# Patient Record
Sex: Female | Born: 1985 | Race: White | Hispanic: No | Marital: Single | Smoking: Former smoker
Health system: Southern US, Community
[De-identification: ages and names within clinical notes are randomized; demographics above are authoritative.]

## PROBLEM LIST (undated history)

## (undated) ENCOUNTER — Emergency Department: Admission: EM | Payer: Self-pay | Source: Home / Self Care

## (undated) DIAGNOSIS — E282 Polycystic ovarian syndrome: Secondary | ICD-10-CM

## (undated) DIAGNOSIS — F32A Depression, unspecified: Secondary | ICD-10-CM

## (undated) DIAGNOSIS — F329 Major depressive disorder, single episode, unspecified: Secondary | ICD-10-CM

## (undated) HISTORY — DX: Polycystic ovarian syndrome: E28.2

## (undated) HISTORY — PX: NO PAST SURGERIES: SHX2092

---
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
## 2007-02-16 ENCOUNTER — Emergency Department: Payer: Self-pay

## 2007-03-04 ENCOUNTER — Emergency Department: Payer: Self-pay | Admitting: Unknown Physician Specialty

## 2007-04-05 ENCOUNTER — Emergency Department: Payer: Self-pay | Admitting: Internal Medicine

## 2007-10-10 ENCOUNTER — Emergency Department: Payer: Self-pay | Admitting: Emergency Medicine

## 2009-11-27 ENCOUNTER — Observation Stay: Payer: Self-pay

## 2009-12-31 ENCOUNTER — Observation Stay: Payer: Self-pay | Admitting: Obstetrics & Gynecology

## 2010-02-11 ENCOUNTER — Inpatient Hospital Stay: Payer: Self-pay

## 2010-05-07 ENCOUNTER — Emergency Department: Payer: Self-pay | Admitting: Emergency Medicine

## 2010-05-17 ENCOUNTER — Emergency Department: Payer: Self-pay | Admitting: Emergency Medicine

## 2013-03-21 ENCOUNTER — Observation Stay: Payer: Self-pay | Admitting: Obstetrics and Gynecology

## 2013-03-30 ENCOUNTER — Inpatient Hospital Stay: Payer: Self-pay | Admitting: Obstetrics & Gynecology

## 2013-03-30 LAB — CBC WITH DIFFERENTIAL/PLATELET
Basophil #: 0 10*3/uL (ref 0.0–0.1)
Basophil %: 0.4 %
Eosinophil #: 0.1 10*3/uL (ref 0.0–0.7)
Eosinophil %: 0.9 %
HCT: 34.4 % — ABNORMAL LOW (ref 35.0–47.0)
HGB: 11.9 g/dL — ABNORMAL LOW (ref 12.0–16.0)
Lymphocyte #: 1.8 10*3/uL (ref 1.0–3.6)
Lymphocyte %: 18 %
MCH: 27.1 pg (ref 26.0–34.0)
MCHC: 34.6 g/dL (ref 32.0–36.0)
MCV: 78 fL — ABNORMAL LOW (ref 80–100)
Monocyte #: 0.8 x10 3/mm (ref 0.2–0.9)
Monocyte %: 7.7 %
Neutrophil #: 7.4 10*3/uL — ABNORMAL HIGH (ref 1.4–6.5)
Neutrophil %: 73 %
Platelet: 152 10*3/uL (ref 150–440)
RBC: 4.39 10*6/uL (ref 3.80–5.20)
RDW: 14.4 % (ref 11.5–14.5)
WBC: 10.2 10*3/uL (ref 3.6–11.0)

## 2013-03-30 LAB — GC/CHLAMYDIA PROBE AMP

## 2013-10-13 ENCOUNTER — Emergency Department: Payer: Self-pay | Admitting: Emergency Medicine

## 2014-01-17 ENCOUNTER — Emergency Department: Payer: Self-pay | Admitting: Emergency Medicine

## 2014-01-21 ENCOUNTER — Emergency Department: Payer: Self-pay | Admitting: Emergency Medicine

## 2014-03-27 ENCOUNTER — Emergency Department: Payer: Self-pay | Admitting: Emergency Medicine

## 2014-04-15 ENCOUNTER — Emergency Department: Payer: Self-pay | Admitting: Emergency Medicine

## 2014-04-15 LAB — URINALYSIS, COMPLETE
Bilirubin,UR: NEGATIVE
Blood: NEGATIVE
GLUCOSE, UR: NEGATIVE mg/dL (ref 0–75)
Nitrite: NEGATIVE
Ph: 5 (ref 4.5–8.0)
Protein: NEGATIVE
RBC,UR: 5 /HPF (ref 0–5)
SPECIFIC GRAVITY: 1.027 (ref 1.003–1.030)
WBC UR: 2 /HPF (ref 0–5)

## 2014-04-15 LAB — GC/CHLAMYDIA PROBE AMP

## 2014-04-15 LAB — PREGNANCY, URINE: Pregnancy Test, Urine: NEGATIVE m[IU]/mL

## 2014-04-15 LAB — WET PREP, GENITAL

## 2014-04-17 LAB — URINE CULTURE

## 2014-04-28 ENCOUNTER — Emergency Department: Payer: Self-pay | Admitting: Emergency Medicine

## 2014-04-28 LAB — COMPREHENSIVE METABOLIC PANEL
Albumin: 3.5 g/dL (ref 3.4–5.0)
Alkaline Phosphatase: 66 U/L
Anion Gap: 9 (ref 7–16)
BILIRUBIN TOTAL: 0.3 mg/dL (ref 0.2–1.0)
BUN: 11 mg/dL (ref 7–18)
CO2: 24 mmol/L (ref 21–32)
Calcium, Total: 8.4 mg/dL — ABNORMAL LOW (ref 8.5–10.1)
Chloride: 108 mmol/L — ABNORMAL HIGH (ref 98–107)
Creatinine: 0.75 mg/dL (ref 0.60–1.30)
EGFR (Non-African Amer.): >60
GLUCOSE: 92 mg/dL (ref 65–99)
OSMOLALITY: 280 (ref 275–301)
Potassium: 4.2 mmol/L (ref 3.5–5.1)
SGOT(AST): 21 U/L (ref 15–37)
SGPT (ALT): 18 U/L
Sodium: 141 mmol/L (ref 136–145)
Total Protein: 7.9 g/dL (ref 6.4–8.2)

## 2014-04-28 LAB — URINALYSIS, COMPLETE
Bacteria: NONE SEEN
Bilirubin,UR: NEGATIVE
Blood: NEGATIVE
Glucose,UR: NEGATIVE mg/dL (ref 0–75)
KETONE: NEGATIVE
Leukocyte Esterase: NEGATIVE
NITRITE: NEGATIVE
Ph: 6 (ref 4.5–8.0)
Protein: NEGATIVE
RBC,UR: 1 /HPF (ref 0–5)
Specific Gravity: 1.023 (ref 1.003–1.030)
WBC UR: <1 /HPF (ref 0–5)

## 2014-04-28 LAB — CBC WITH DIFFERENTIAL/PLATELET
BASOS ABS: 0.1 10*3/uL (ref 0.0–0.1)
Basophil %: 0.7 %
EOS ABS: 0.1 10*3/uL (ref 0.0–0.7)
EOS PCT: 1.1 %
HCT: 44.1 % (ref 35.0–47.0)
HGB: 14.4 g/dL (ref 12.0–16.0)
LYMPHS ABS: 2.4 10*3/uL (ref 1.0–3.6)
Lymphocyte %: 21.9 %
MCH: 28.9 pg (ref 26.0–34.0)
MCHC: 32.5 g/dL (ref 32.0–36.0)
MCV: 89 fL (ref 80–100)
MONOS PCT: 6 %
Monocyte #: 0.7 x10 3/mm (ref 0.2–0.9)
Neutrophil #: 7.7 10*3/uL — ABNORMAL HIGH (ref 1.4–6.5)
Neutrophil %: 70.3 %
Platelet: 216 10*3/uL (ref 150–440)
RBC: 4.96 10*6/uL (ref 3.80–5.20)
RDW: 13.7 % (ref 11.5–14.5)
WBC: 10.9 10*3/uL (ref 3.6–11.0)

## 2014-04-28 LAB — LIPASE, BLOOD: Lipase: 127 U/L (ref 73–393)

## 2014-06-07 ENCOUNTER — Emergency Department: Payer: Self-pay | Admitting: Internal Medicine

## 2014-09-28 ENCOUNTER — Emergency Department: Payer: Self-pay | Admitting: Emergency Medicine

## 2014-10-26 ENCOUNTER — Emergency Department: Payer: Self-pay | Admitting: Emergency Medicine

## 2014-12-22 NOTE — H&P (Signed)
L&D Evaluation:  History Expanded:  HPI Patient report to Labor and Delivery after having contractions that started yesterday that have gotten more painful.   Gravida 3   Term 2   PreTerm 0   Abortion 0   Living 2   Blood Type (Maternal) A positive   Group B Strep Results Maternal (Result >5wks must be treated as unknown) positive   Maternal HIV Negative   Maternal Syphilis Ab Nonreactive   Maternal Varicella Unknown   Rubella Results (Maternal) immune   Maternal T-Dap Unknown   [facility]   Presents with contractions   Patient's Medical History other  depression   Patient's Surgical History none   Medications Pre Natal Vitamins   Allergies NKDA   Social History none   Family History other  HTN, DM II, MS   ROS:  ROS All systems were reviewed.  HEENT, CNS, GI, GU, Respiratory, CV, Renal and Musculoskeletal systems were found to be normal.   Exam:  Vital Signs stable   General no apparent distress   Mental Status clear   Chest clear   Heart normal sinus rhythm   Abdomen gravid, non-tender   Edema no edema   Pelvic 5/80/-2 by RN   Mebranes Intact   FHT normal rate with no decels   Fetal Heart Rate 140   Ucx irregular   Skin dry   Impression:  Impression active labor   Plan:  Plan EFM/NST, monitor contractions and for cervical change, antibiotics for GBBS prophylaxis, fluids   Comments Epidural when desired AROM potentially after abx Anticipate spontaneous vaginal delivery.   Electronic Signatures: Letitia LibraHarris, Zafiro Routson Paul (MD)  (Signed 17-Aug-14 14:47)  Authored: L&D Evaluation   Last Updated: 17-Aug-14 14:47 by Letitia LibraHarris, Orlene Salmons Paul (MD)

## 2014-12-30 ENCOUNTER — Emergency Department
Admission: EM | Admit: 2014-12-30 | Discharge: 2014-12-30 | Disposition: A | Discharge status: Home / Self Care | Payer: Self-pay | Attending: Emergency Medicine | Admitting: Emergency Medicine

## 2014-12-30 DIAGNOSIS — H9201 Otalgia, right ear: Secondary | ICD-10-CM | POA: Insufficient documentation

## 2014-12-30 MED ORDER — NEOMYCIN-POLYMYXIN-HC 3.5-10000-1 OT SOLN: 3.0000 [drp] | Freq: Three times a day (TID) | OTIC | Status: AC

## 2014-12-30 MED ORDER — TRAMADOL HCL 50 MG PO TABS: 50.0000 mg | ORAL_TABLET | Freq: Four times a day (QID) | ORAL | Status: DC | PRN

## 2014-12-30 NOTE — ED Notes (Signed)
Right ear pain that began 3 hours PTA. No injury. Pt alert and oriented X4, active, cooperative, pt in NAD. RR even and unlabored, color WNL.

## 2014-12-30 NOTE — Discharge Instructions (Signed)
Ear Drops °You need to put eardrops in your ear. °HOME CARE  °· Put drops in your affected ear as told. °· After putting in the drops, lie down with the ear you put the drops in facing up. Stay this way for 10 minutes. Use the ear drops as long as your doctor tells you. °· Before you get up, put a cotton ball gently in your ear. Do not push it far in your ear. °· Do not wash out your ears unless your doctor says it is okay. °· Finish all medicines as told by your doctor. You may be told to keep using the eardrops even if you start to feel better. °· See your doctor as told for follow-up visits. °GET HELP IF: °· You have pain that gets worse. °· Any unusual fluid (drainage) is coming from your ear (especially if the fluid stinks). °· You have trouble hearing. °· You get really dizzy as if the room is spinning and feel sick to your stomach (vertigo). °· The outside of your ear becomes red or puffy or both. This may be a sign of an allergic reaction. °MAKE SURE YOU:  °· Understand these instructions. °· Will watch your condition. °· Will get help right away if you are not doing well or get worse. °Document Released: 01/18/2010 Document Revised: 08/05/2013 Document Reviewed: 02/25/2013 °[employer]® Patient Information ©2015 [employer]. This information is not intended to replace advice given to you by your health care provider. Make sure you discuss any questions you have with your health care provider. ° °

## 2014-12-30 NOTE — ED Notes (Signed)
Pt alert and oriented X4, active, cooperative, pt in NAD. RR even and unlabored, color WNL.  Pt informed to return if any life threatening symptoms occur.   

## 2014-12-30 NOTE — ED Provider Notes (Signed)
[facility] Emergency Department Provider Note  ____________________________________________  Time seen: Approximately 1:35 PM  I have reviewed the triage vital signs and the nursing notes.   HISTORY  Chief Complaint Otalgia    HPI Kimberly Arroyo is a 29 y.o. female patient complained of right ear pain which began about 3 hours when she arrived to the ER. Patient state last night she was cleaning both issues and Q-tips but she did not remove any debris from ears. Patient denies any hearing loss just pain in the ear canal. The left ear is not affected. She is rating her pain as 8/10.   History reviewed. No pertinent past medical history.  There are no active problems to display for this patient.   History reviewed. No pertinent past surgical history.  Current Outpatient Rx  Name  Route  Sig  Dispense  Refill  . neomycin-polymyxin-hydrocortisone (CORTISPORIN) otic solution   Left Ear   Place 3 drops into the left ear 3 (three) times daily.   10 mL   0   . traMADol (ULTRAM) 50 MG tablet   Oral   Take 1 tablet (50 mg total) by mouth every 6 (six) hours as needed for moderate pain.   12 tablet   0     Allergies Review of patient's allergies indicates no known allergies.  History reviewed. No pertinent family history.  Social History History  Substance Use Topics  . Smoking status: Never Smoker   . Smokeless tobacco: Not on file  . Alcohol Use: Yes    Review of Systems Constitutional: No fever/chills Eyes: No visual changes. ENT: No sore throat. Cardiovascular: Denies chest pain. Respiratory: Denies shortness of breath. Gastrointestinal: No abdominal pain.  No nausea, no vomiting.  No diarrhea.  No constipation. Genitourinary: Negative for dysuria. Musculoskeletal: Negative for back pain. Skin: Negative for rash. Neurological: Negative for headaches, focal weakness or numbness.  10-point ROS otherwise  negative.  ____________________________________________   PHYSICAL EXAM:  VITAL SIGNS: ED Triage Vitals  Enc Vitals Group     BP 12/30/14 1240 149/79 mmHg     Pulse Rate 12/30/14 1240 79     Resp 12/30/14 1240 16     Temp 12/30/14 1240 98.9 F (37.2 C)     Temp Source 12/30/14 1240 Oral     SpO2 12/30/14 1240 100 %     Weight 12/30/14 1240 230 lb (104.327 kg)     Height 12/30/14 1240 5\' 5"  (1.651 m)     Head Cir --      Peak Flow --      Pain Score 12/30/14 1240 8     Pain Loc --      Pain Edu? --      Excl. in GC? --     Constitutional: Alert and oriented. Well appearing and in mild distress distress. Eyes: Conjunctivae are normal. PERRL. EOMI. Head: Atraumatic. Nose: No congestion/rhinnorhea. Right canal is erythematous and mildly edematous. TM is nonerythematous and nonbulging. Patient had guarding with insertion of the Otoscope Mouth/Throat: Mucous membranes are moist.  Oropharynx non-erythematous. Neck: No stridor. Neck is supple Hematological/Lymphatic/Immunilogical: No cervical lymphadenopathy. Cardiovascular: Normal rate, regular rhythm. Grossly normal heart sounds.  Good peripheral circulation. Respiratory: Normal respiratory effort.  No retractions. Lungs CTAB. Gastrointestinal: Soft and nontender. No distention. No abdominal bruits. No CVA tenderness. Musculoskeletal: No lower extremity tenderness nor edema.  No joint effusions. Neurologic:  Normal speech and language. No gross focal neurologic deficits are appreciated. Speech is normal. No  gait instability. Skin:  Skin is warm, dry and intact. No rash noted. Psychiatric: Mood and affect are normal. Speech and behavior are normal.  ____________________________________________   LABS (all labs ordered are listed, but only abnormal results are displayed)  Labs Reviewed - No data to  display ____________________________________________  EKG   ____________________________________________  RADIOLOGY   ____________________________________________   PROCEDURES  Procedure(s) performed: None  Critical Care performed: No  ____________________________________________   INITIAL IMPRESSION / ASSESSMENT AND PLAN / ED COURSE  Pertinent labs & imaging results that were available during my care of the patient were reviewed by me and considered in my medical decision making (see chart for details).  Otitis Externa ____________________________________________   FINAL CLINICAL IMPRESSION(S) / ED DIAGNOSES  Final diagnoses:  Otalgia of right ear      Joni ReiningRonald K Camelle Henkels, PA-C 12/30/14 1342

## 2014-12-30 NOTE — ED Notes (Signed)
Pt presents with right ear pain.

## 2015-05-03 ENCOUNTER — Emergency Department
Admission: EM | Admit: 2015-05-03 | Discharge: 2015-05-03 | Disposition: A | Discharge status: Home / Self Care | Payer: Self-pay | Attending: Emergency Medicine | Admitting: Emergency Medicine

## 2015-05-03 DIAGNOSIS — N3001 Acute cystitis with hematuria: Secondary | ICD-10-CM | POA: Insufficient documentation

## 2015-05-03 DIAGNOSIS — Z3202 Encounter for pregnancy test, result negative: Secondary | ICD-10-CM | POA: Insufficient documentation

## 2015-05-03 LAB — URINALYSIS COMPLETE WITH MICROSCOPIC (ARMC ONLY)
BACTERIA UA: NONE SEEN
Bilirubin Urine: NEGATIVE
GLUCOSE, UA: NEGATIVE mg/dL
Hgb urine dipstick: NEGATIVE
Ketones, ur: NEGATIVE mg/dL
NITRITE: NEGATIVE
Protein, ur: 30 mg/dL — AB
Specific Gravity, Urine: 1.019 (ref 1.005–1.030)
pH: 6 (ref 5.0–8.0)

## 2015-05-03 MED ORDER — PHENAZOPYRIDINE HCL 200 MG PO TABS: 200.0000 mg | ORAL_TABLET | Freq: Three times a day (TID) | ORAL | Status: DC | PRN

## 2015-05-03 MED ORDER — SULFAMETHOXAZOLE-TRIMETHOPRIM 800-160 MG PO TABS: 1.0000 | ORAL_TABLET | Freq: Two times a day (BID) | ORAL | Status: DC

## 2015-05-03 NOTE — ED Provider Notes (Signed)
[facility] Emergency Department Provider Note ____________________________________________  Time seen: Approximately 6:17 PM  I have reviewed the triage vital signs and the nursing notes.   HISTORY  Chief Complaint Urinary Tract Infection    HPI Kimberly Arroyo is a 29 y.o. female who presents to the emergency department for evaluation of frequent urination. She states that she has some discomfort with urination as well. Symptoms started yesterday. She has not noticed any blood in the urine. She also has some mild left lower back pain.   History reviewed. No pertinent past medical history.  There are no active problems to display for this patient.   History reviewed. No pertinent past surgical history.  Current Outpatient Rx  Name  Route  Sig  Dispense  Refill  . phenazopyridine (PYRIDIUM) 200 MG tablet   Oral   Take 1 tablet (200 mg total) by mouth 3 (three) times daily as needed for pain.   9 tablet   0   . sulfamethoxazole-trimethoprim (BACTRIM DS,SEPTRA DS) 800-160 MG per tablet   Oral   Take 1 tablet by mouth 2 (two) times daily.   10 tablet   0   . traMADol (ULTRAM) 50 MG tablet   Oral   Take 1 tablet (50 mg total) by mouth every 6 (six) hours as needed for moderate pain.   12 tablet   0     Allergies Review of patient's allergies indicates no known allergies.  No family history on file.  Social History Social History  Substance Use Topics  . Smoking status: Never Smoker   . Smokeless tobacco: None  . Alcohol Use: Yes    Review of Systems Constitutional: No fever/chills Cardiovascular: Denies chest pain. Respiratory: Denies shortness of breath or cough. Gastrointestinal: Abdominal pain no., nausea no, vomitingno. Genitourinary: Dysuria yes, vaginal discharge no.. Musculoskeletal: Negative for back pain. Skin: Negative for rash. Neurological: Negative for headaches, focal weakness or numbness.  10-point ROS otherwise  negative.  ____________________________________________   PHYSICAL EXAM:   T: 98.3; HR 79; Resp: 18; BP: 120/75; SpO2 98%.  VITAL SIGNS: ED Triage Vitals  Enc Vitals Group     BP --      Pulse --      Resp --      Temp --      Temp src --      SpO2 --      Weight --      Height --      Head Cir --      Peak Flow --      Pain Score 05/03/15 1816 4     Pain Loc --      Pain Edu? --      Excl. in GC? --     Constitutional: Alert and oriented. Well appearing and in no acute distress. Eyes: Conjunctivae are normal. PERRL. EOMI. Head: Atraumatic. Nose: No congestion/rhinnorhea. Mouth/Throat: Mucous membranes are moist.  Oropharynx non-erythematous. Neck: No stridor. Cardiovascular: Good peripheral circulation. Respiratory: Normal respiratory effort.  No retractions. Gastrointestinal: Soft and nontender. No distention. No abdominal bruits. Genitourinary: Pelvic exam: Not indicated Musculoskeletal: No extremity tenderness nor edema.  Neurologic:  Normal speech and language. No gross focal neurologic deficits are appreciated. Speech is normal. No gait instability. Skin:  Skin is warm, dry and intact. No rash noted. Psychiatric: Mood and affect are normal. Speech and behavior are normal.  ____________________________________________   LABS (all labs ordered are listed, but only abnormal results are displayed)  Labs  Reviewed  URINALYSIS COMPLETEWITH MICROSCOPIC ([facility] ONLY) - Abnormal; Notable for the following:    Color, Urine YELLOW (*)    APPearance HAZY (*)    Protein, ur 30 (*)    Leukocytes, UA 2+ (*)    Squamous Epithelial / LPF 0-5 (*)    All other components within normal limits  POC URINE PREG, ED   ____________________________________________  RADIOLOGY  Not indicated ____________________________________________   PROCEDURES  Procedure(s) performed: None  ____________________________________________   INITIAL IMPRESSION / ASSESSMENT AND PLAN /  ED COURSE  Pertinent labs & imaging results that were available during my care of the patient were reviewed by me and considered in my medical decision making (see chart for details).  Patient was advised to follow-up with the primary care provider for choice for a repeat urinalysis in about 10 days. She will take Bactrim as prescribed as well as Pyridium as needed.   She was advised to return to the emergency department for symptoms that change or worsen if she is unable schedule an appointment. ____________________________________________   FINAL CLINICAL IMPRESSION(S) / ED DIAGNOSES  Final diagnoses:  Acute cystitis with hematuria       Chinita Pester, FNP 05/03/15 1610  Jennye Moccasin, MD 05/03/15 2243

## 2015-05-03 NOTE — ED Notes (Signed)
Increased frequency, painful urination, pressure. Denies blood.

## 2015-05-03 NOTE — Discharge Instructions (Signed)

## 2015-05-04 LAB — POCT PREGNANCY, URINE: Preg Test, Ur: NEGATIVE

## 2015-10-12 ENCOUNTER — Emergency Department: Payer: Managed Care, Other (non HMO)

## 2015-10-12 ENCOUNTER — Encounter: Payer: Self-pay | Admitting: Medical Oncology

## 2015-10-12 ENCOUNTER — Emergency Department
Admission: EM | Admit: 2015-10-12 | Discharge: 2015-10-12 | Disposition: A | Discharge status: Home / Self Care | Payer: Managed Care, Other (non HMO) | Attending: Emergency Medicine | Admitting: Emergency Medicine

## 2015-10-12 DIAGNOSIS — Y9289 Other specified places as the place of occurrence of the external cause: Secondary | ICD-10-CM | POA: Insufficient documentation

## 2015-10-12 DIAGNOSIS — W501XXA Accidental kick by another person, initial encounter: Secondary | ICD-10-CM | POA: Insufficient documentation

## 2015-10-12 DIAGNOSIS — S20212A Contusion of left front wall of thorax, initial encounter: Secondary | ICD-10-CM | POA: Insufficient documentation

## 2015-10-12 DIAGNOSIS — S29001A Unspecified injury of muscle and tendon of front wall of thorax, initial encounter: Secondary | ICD-10-CM | POA: Diagnosis present

## 2015-10-12 DIAGNOSIS — Y9389 Activity, other specified: Secondary | ICD-10-CM | POA: Diagnosis not present

## 2015-10-12 DIAGNOSIS — Z792 Long term (current) use of antibiotics: Secondary | ICD-10-CM | POA: Diagnosis not present

## 2015-10-12 DIAGNOSIS — F17200 Nicotine dependence, unspecified, uncomplicated: Secondary | ICD-10-CM | POA: Diagnosis not present

## 2015-10-12 DIAGNOSIS — Y998 Other external cause status: Secondary | ICD-10-CM | POA: Insufficient documentation

## 2015-10-12 MED ORDER — MELOXICAM 15 MG PO TABS: 15.0000 mg | ORAL_TABLET | Freq: Every day | ORAL | Status: DC

## 2015-10-12 NOTE — ED Notes (Signed)
See triage note. Having pain under left breast and rib area. States her and ber b/f were playing around and he kicked her in the rib area

## 2015-10-12 NOTE — ED Notes (Signed)
Pt reports Saturday her and her boyfriend were "playing around" when his foot kicked her left rib cage, pt reports pain to area. Denies sob. Denies other sx's.

## 2015-10-12 NOTE — Discharge Instructions (Signed)
Blunt Chest Trauma °Blunt chest trauma is an injury caused by a blow to the chest. These chest injuries can be very painful. Blunt chest trauma often results in bruised or broken (fractured) ribs. Most cases of bruised and fractured ribs from blunt chest traumas get better after 1 to 3 weeks of rest and pain medicine. Often, the soft tissue in the chest wall is also injured, causing pain and bruising. Internal organs, such as the heart and lungs, may also be injured. Blunt chest trauma can lead to serious medical problems. This injury requires immediate medical care. °CAUSES  °· Motor vehicle collisions. °· Falls. °· Physical violence. °· Sports injuries. °SYMPTOMS  °· Chest pain. The pain may be worse when you move or breathe deeply. °· Shortness of breath. °· Lightheadedness. °· Bruising. °· Tenderness. °· Swelling. °DIAGNOSIS  °Your caregiver will do a physical exam. X-rays may be taken to look for fractures. However, minor rib fractures may not show up on X-rays until a few days after the injury. If a more serious injury is suspected, further imaging tests may be done. This may include ultrasounds, computed tomography (CT) scans, or magnetic resonance imaging (MRI). °TREATMENT  °Treatment depends on the severity of your injury. Your caregiver may prescribe pain medicines and deep breathing exercises. °HOME CARE INSTRUCTIONS °· Limit your activities until you can move around without much pain. °· Do not do any strenuous work until your injury is healed. °· Put ice on the injured area. °· Put ice in a plastic bag. °· Place a towel between your skin and the bag. °· Leave the ice on for 15-20 minutes, 03-04 times a day. °· You may wear a rib belt as directed by your caregiver to reduce pain. °· Practice deep breathing as directed by your caregiver to keep your lungs clear. °· Only take over-the-counter or prescription medicines for pain, fever, or discomfort as directed by your caregiver. °SEEK IMMEDIATE MEDICAL  CARE IF:  °· You have increasing pain or shortness of breath. °· You cough up blood. °· You have nausea, vomiting, or abdominal pain. °· You have a fever. °· You feel dizzy, weak, or you faint. °MAKE SURE YOU: °· Understand these instructions. °· Will watch your condition. °· Will get help right away if you are not doing well or get worse. °  °This information is not intended to replace advice given to you by your health care provider. Make sure you discuss any questions you have with your health care provider. °  °Document Released: 09/07/2004 Document Revised: 08/21/2014 Document Reviewed: 01/27/2015 °Elsevier Interactive Patient Education ©2016 [employer] ° °Chest Contusion °A chest contusion is a deep bruise on your chest area. Contusions are the result of an injury that caused bleeding under the skin. A chest contusion may involve bruising of the skin, muscles, or ribs. The contusion may turn blue, purple, or yellow. Minor injuries will give you a painless contusion, but more severe contusions may stay painful and swollen for a few weeks. °CAUSES  °A contusion is usually caused by a blow, trauma, or direct force to an area of the body. °SYMPTOMS  °· Swelling and redness of the injured area. °· Discoloration of the injured area. °· Tenderness and soreness of the injured area. °· Pain. °DIAGNOSIS  °The diagnosis can be made by taking a history and performing a physical exam. An X-ray, CT scan, or MRI may be needed to determine if there were any associated injuries, such as broken bones (fractures)   or internal injuries. °TREATMENT  °Often, the best treatment for a chest contusion is resting, icing, and applying cold compresses to the injured area. Deep breathing exercises may be recommended to reduce the risk of pneumonia. Over-the-counter medicines may also be recommended for pain control. °HOME CARE INSTRUCTIONS  °· Put ice on the injured area. °¨ Put ice in a plastic bag. °¨ Place a towel between your skin  and the bag. °¨ Leave the ice on for 15-20 minutes, 03-04 times a day. °· Only take over-the-counter or prescription medicines as directed by your caregiver. Your caregiver may recommend avoiding anti-inflammatory medicines (aspirin, ibuprofen, and naproxen) for 48 hours because these medicines may increase bruising. °· Rest the injured area. °· Perform deep-breathing exercises as directed by your caregiver. °· Stop smoking if you smoke. °· Do not lift objects over 5 pounds (2.3 kg) for 3 days or longer if recommended by your caregiver. °SEEK IMMEDIATE MEDICAL CARE IF:  °· You have increased bruising or swelling. °· You have pain that is getting worse. °· You have difficulty breathing. °· You have dizziness, weakness, or fainting. °· You have blood in your urine or stool. °· You cough up or vomit blood. °· Your swelling or pain is not relieved with medicines. °MAKE SURE YOU:  °· Understand these instructions. °· Will watch your condition. °· Will get help right away if you are not doing well or get worse. °  °This information is not intended to replace advice given to you by your health care provider. Make sure you discuss any questions you have with your health care provider. °  °Document Released: 04/25/2001 Document Revised: 04/24/2012 Document Reviewed: 01/22/2012 °Elsevier Interactive Patient Education ©2016 [employer] ° °
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
## 2015-10-20 ENCOUNTER — Emergency Department
Admission: EM | Admit: 2015-10-20 | Discharge: 2015-10-20 | Disposition: A | Discharge status: Home / Self Care | Payer: Managed Care, Other (non HMO) | Attending: Emergency Medicine | Admitting: Emergency Medicine

## 2015-10-20 DIAGNOSIS — Z791 Long term (current) use of non-steroidal anti-inflammatories (NSAID): Secondary | ICD-10-CM | POA: Insufficient documentation

## 2015-10-20 DIAGNOSIS — F17210 Nicotine dependence, cigarettes, uncomplicated: Secondary | ICD-10-CM | POA: Insufficient documentation

## 2015-10-20 DIAGNOSIS — Z79899 Other long term (current) drug therapy: Secondary | ICD-10-CM | POA: Diagnosis not present

## 2015-10-20 DIAGNOSIS — B349 Viral infection, unspecified: Secondary | ICD-10-CM | POA: Diagnosis not present

## 2015-10-20 DIAGNOSIS — R52 Pain, unspecified: Secondary | ICD-10-CM | POA: Diagnosis present

## 2015-10-20 MED ORDER — IBUPROFEN 800 MG PO TABS: 800.0000 mg | ORAL_TABLET | Freq: Three times a day (TID) | ORAL | Status: DC | PRN

## 2015-10-20 MED ORDER — PSEUDOEPH-BROMPHEN-DM 30-2-10 MG/5ML PO SYRP: 5.0000 mL | ORAL_SOLUTION | Freq: Four times a day (QID) | ORAL | Status: DC | PRN

## 2015-10-20 MED ORDER — OSELTAMIVIR PHOSPHATE 75 MG PO CAPS: 75.0000 mg | ORAL_CAPSULE | Freq: Two times a day (BID) | ORAL | Status: AC

## 2015-10-20 NOTE — ED Notes (Signed)
Pt c/o bodyaches and cold/hot sweats that started last night..Marland Kitchen

## 2015-10-20 NOTE — ED Provider Notes (Signed)
[facility] Emergency Department Provider Note  ____________________________________________  Time seen: Approximately 8:14 AM  I have reviewed the triage vital signs and the nursing notes.   HISTORY  Chief Complaint Generalized Body Aches and Night Sweats    HPI Kimberly Arroyo is a 30 y.o. female patient complain fever or chills sweats body aches onset last night. Patient also complaining of nasal congestion for intermittently runny nose. Patient stated there is nausea but no vomiting. Patient denies diarrhea. Patient states she has not taken a flu shot this season. No palliative measures taken for this complaint. Patient denies pain to his "achy feeling".  History reviewed. No pertinent past medical history.  There are no active problems to display for this patient.   History reviewed. No pertinent past surgical history.  Current Outpatient Rx  Name  Route  Sig  Dispense  Refill  . traMADol (ULTRAM) 50 MG tablet   Oral   Take 1 tablet (50 mg total) by mouth every 6 (six) hours as needed for moderate pain.   12 tablet   0   . brompheniramine-pseudoephedrine-DM 30-2-10 MG/5ML syrup   Oral   Take 5 mLs by mouth 4 (four) times daily as needed.   120 mL   0   . ibuprofen (ADVIL,MOTRIN) 800 MG tablet   Oral   Take 1 tablet (800 mg total) by mouth every 8 (eight) hours as needed for moderate pain.   15 tablet   0   . meloxicam (MOBIC) 15 MG tablet   Oral   Take 1 tablet (15 mg total) by mouth daily.   30 tablet   0   . oseltamivir (TAMIFLU) 75 MG capsule   Oral   Take 1 capsule (75 mg total) by mouth 2 (two) times daily.   20 capsule   0   . phenazopyridine (PYRIDIUM) 200 MG tablet   Oral   Take 1 tablet (200 mg total) by mouth 3 (three) times daily as needed for pain.   9 tablet   0   . sulfamethoxazole-trimethoprim (BACTRIM DS,SEPTRA DS) 800-160 MG per tablet   Oral   Take 1 tablet by mouth 2 (two) times daily.   10 tablet   0      Allergies Review of patient's allergies indicates no known allergies.  No family history on file.  Social History Social History  Substance Use Topics  . Smoking status: Current Every Day Smoker    Types: Cigarettes  . Smokeless tobacco: None  . Alcohol Use: Yes     Comment: occ    Review of Systems Constitutional: Fever chills and body aches Eyes: No visual changes. ENT: No sore throat. Nasal congestion runny nose Cardiovascular: Denies chest pain. Respiratory: Denies shortness of breath. Gastrointestinal: No abdominal pain.  Nausea without vomiting.  No diarrhea.  No constipation. Genitourinary: Negative for dysuria. Musculoskeletal: Negative for back pain. Skin: Negative for rash. Neurological: Negative for headaches, focal weakness or numbness.   10-point ROS otherwise negative.  ____________________________________________   PHYSICAL EXAM:  VITAL SIGNS: ED Triage Vitals  Enc Vitals Group     BP 10/20/15 0802 122/64 mmHg     Pulse Rate 10/20/15 0802 84     Resp 10/20/15 0802 18     Temp 10/20/15 0802 98.8 F (37.1 C)     Temp Source 10/20/15 0802 Oral     SpO2 10/20/15 0802 97 %     Weight 10/20/15 0802 230 lb (104.327 kg)  Height 10/20/15 0802 5\' 5"  (1.651 m)     Head Cir --      Peak Flow --      Pain Score 10/20/15 0803 0     Pain Loc --      Pain Edu? --      Excl. in GC? --     Constitutional: Alert and oriented. Appears malaise. Eyes: Conjunctivae are normal. PERRL. EOMI. Head: Atraumatic. Nose: Edematous nasal turbinates with clear rhinorrhea. Mouth/Throat: Mucous membranes are moist.  Oropharynx non-erythematous. Neck: No stridor.  No cervical spine tenderness to palpation. ematological/Lymphatic/Immunilogical: No cervical lymphadenopathy. Cardiovascular: Normal rate, regular rhythm. Grossly normal heart sounds.  Good peripheral circulation. Respiratory: Normal respiratory effort.  No retractions. Lungs CTAB. Nonproductive  cough Gastrointestinal: Soft and nontender. No distention. No abdominal bruits. No CVA tenderness. Musculoskeletal: No lower extremity tenderness nor edema.  No joint effusions. Neurologic:  Normal speech and language. No gross focal neurologic deficits are appreciated. No gait instability. Skin:  Skin is warm, dry and intact. No rash noted. Psychiatric: Mood and affect are normal. Speech and behavior are normal.  ____________________________________________   LABS (all labs ordered are listed, but only abnormal results are displayed)  Labs Reviewed - No data to display ____________________________________________  EKG   ____________________________________________  RADIOLOGY   ____________________________________________   PROCEDURES  Procedure(s) performed: None  Critical Care performed: No  ____________________________________________   INITIAL IMPRESSION / ASSESSMENT AND PLAN / ED COURSE  Pertinent labs & imaging results that were available during my care of the patient were reviewed by me and considered in my medical decision making (see chart for details).  Viral illness. She given discharge Instructions. [facility] Tamiflu, and ibuprofen. Patient given a work note for 2 days. Advised to [facility]. ____________________________________________   FINAL CLINICAL IMPRESSION(S) / ED DIAGNOSES  Final diagnoses:  Viral illness      Joni ReiningRonald K Smith, PA-C 10/20/15 [id number]  Emily FilbertJonathan E Williams, MD 10/22/15 [phone]

## 2015-10-20 NOTE — ED Notes (Signed)
States she just doesn't feel well since yesterday ..body aches with hot/cold sweats   Unsure of fever  Denies any cough or n/v

## 2015-10-22 ENCOUNTER — Emergency Department
Admission: EM | Admit: 2015-10-22 | Discharge: 2015-10-22 | Disposition: A | Discharge status: Home / Self Care | Payer: Managed Care, Other (non HMO) | Attending: Emergency Medicine | Admitting: Emergency Medicine

## 2015-10-22 ENCOUNTER — Encounter: Payer: Self-pay | Admitting: Medical Oncology

## 2015-10-22 DIAGNOSIS — R300 Dysuria: Secondary | ICD-10-CM | POA: Insufficient documentation

## 2015-10-22 DIAGNOSIS — Z79899 Other long term (current) drug therapy: Secondary | ICD-10-CM | POA: Insufficient documentation

## 2015-10-22 DIAGNOSIS — N898 Other specified noninflammatory disorders of vagina: Secondary | ICD-10-CM | POA: Insufficient documentation

## 2015-10-22 DIAGNOSIS — R350 Frequency of micturition: Secondary | ICD-10-CM | POA: Insufficient documentation

## 2015-10-22 DIAGNOSIS — F17210 Nicotine dependence, cigarettes, uncomplicated: Secondary | ICD-10-CM | POA: Insufficient documentation

## 2015-10-22 DIAGNOSIS — J3489 Other specified disorders of nose and nasal sinuses: Secondary | ICD-10-CM | POA: Diagnosis not present

## 2015-10-22 DIAGNOSIS — Z792 Long term (current) use of antibiotics: Secondary | ICD-10-CM | POA: Diagnosis not present

## 2015-10-22 DIAGNOSIS — Z791 Long term (current) use of non-steroidal anti-inflammatories (NSAID): Secondary | ICD-10-CM | POA: Diagnosis not present

## 2015-10-22 LAB — URINALYSIS COMPLETE WITH MICROSCOPIC (ARMC ONLY)
Bilirubin Urine: NEGATIVE
Glucose, UA: NEGATIVE mg/dL
LEUKOCYTES UA: NEGATIVE
NITRITE: NEGATIVE
PH: 5 (ref 5.0–8.0)
PROTEIN: NEGATIVE mg/dL
SPECIFIC GRAVITY, URINE: 1.03 (ref 1.005–1.030)

## 2015-10-22 LAB — CHLAMYDIA/NGC RT PCR (ARMC ONLY)
CHLAMYDIA TR: NOT DETECTED
N GONORRHOEAE: NOT DETECTED

## 2015-10-22 LAB — WET PREP, GENITAL
Clue Cells Wet Prep HPF POC: NONE SEEN
Sperm: NONE SEEN
Trich, Wet Prep: NONE SEEN
Yeast Wet Prep HPF POC: NONE SEEN

## 2015-10-22 NOTE — ED Provider Notes (Signed)
[facility] Emergency Department Provider Note ____________________________________________  Time seen: 1248  I have reviewed the triage vital signs and the nursing notes.  HISTORY  Chief Complaint  Dysuria  HPI Kimberly Arroyo is a 30 y.o. female sensitivity ED for evaluation of dysuria that includes urinary frequency and burning for the last day. She denies any foul odor to the urine and denies any nausea, vomiting or hematuria. She has noted some scant vaginal discharge and is concerned for yeast infection.She denies any current vaginal or pelvic pain.  History reviewed. No pertinent past medical history.  There are no active problems to display for this patient.  History reviewed. No pertinent past surgical history.  Current Outpatient Rx  Name  Route  Sig  Dispense  Refill  . brompheniramine-pseudoephedrine-DM 30-2-10 MG/5ML syrup   Oral   Take 5 mLs by mouth 4 (four) times daily as needed.   120 mL   0   . ibuprofen (ADVIL,MOTRIN) 800 MG tablet   Oral   Take 1 tablet (800 mg total) by mouth every 8 (eight) hours as needed for moderate pain.   15 tablet   0   . meloxicam (MOBIC) 15 MG tablet   Oral   Take 1 tablet (15 mg total) by mouth daily.   30 tablet   0   . oseltamivir (TAMIFLU) 75 MG capsule   Oral   Take 1 capsule (75 mg total) by mouth 2 (two) times daily.   20 capsule   0   . phenazopyridine (PYRIDIUM) 200 MG tablet   Oral   Take 1 tablet (200 mg total) by mouth 3 (three) times daily as needed for pain.   9 tablet   0   . sulfamethoxazole-trimethoprim (BACTRIM DS,SEPTRA DS) 800-160 MG per tablet   Oral   Take 1 tablet by mouth 2 (two) times daily.   10 tablet   0   . traMADol (ULTRAM) 50 MG tablet   Oral   Take 1 tablet (50 mg total) by mouth every 6 (six) hours as needed for moderate pain.   12 tablet   0    Allergies Review of patient's allergies indicates no known allergies.  No family history on  file.  Social History Social History  Substance Use Topics  . Smoking status: Current Every Day Smoker    Types: Cigarettes  . Smokeless tobacco: None  . Alcohol Use: Yes     Comment: occ   Review of Systems  Constitutional: Negative for fever. Eyes: Negative for visual changes. ENT: Negative for sore throat. Cardiovascular: Negative for chest pain. Respiratory: Negative for shortness of breath. Gastrointestinal: Negative for abdominal pain, vomiting and diarrhea. Genitourinary: Positive for dysuria. Reports vaginal discharge as above. Musculoskeletal: Negative for back pain. Skin: Negative for rash. Neurological: Negative for headaches, focal weakness or numbness. ____________________________________________  PHYSICAL EXAM:  VITAL SIGNS: ED Triage Vitals  Enc Vitals Group     BP 10/22/15 1143 120/57 mmHg     Pulse Rate 10/22/15 1143 91     Resp 10/22/15 1143 19     Temp 10/22/15 1143 98.3 F (36.8 C)     Temp Source 10/22/15 1143 Oral     SpO2 10/22/15 1143 98 %     Weight 10/22/15 1143 230 lb (104.327 kg)     Height 10/22/15 1143 5\' 5"  (1.651 m)     Head Cir --      Peak Flow --      Pain Score  10/22/15 1143 0     Pain Loc --      Pain Edu? --      Excl. in GC? --    Constitutional: Alert and oriented. Well appearing and in no distress. Head: Normocephalic and atraumatic.      Eyes: Conjunctivae are normal. PERRL. Normal extraocular movements      Ears: Canals clear. TMs intact bilaterally.   Nose: Mild nasal congestion. Clear rhinorrhea.   Mouth/Throat: Mucous membranes are moist.   Neck: Supple. No thyromegaly. Hematological/Lymphatic/Immunological: No cervical lymphadenopathy. Cardiovascular: Normal rate, regular rhythm.  Respiratory: Normal respiratory effort. No wheezes/rales/rhonchi. Gastrointestinal: Soft and nontender. No distention. GU: Normal external genitalia. Vaginal canal with a scant, dark discharge. The cervix is closed with IUD  strings visible. Musculoskeletal: Nontender with normal range of motion in all extremities.  Neurologic:  Normal gait without ataxia. Normal speech and language. No gross focal neurologic deficits are appreciated. Skin:  Skin is warm, dry and intact. No rash noted. Psychiatric: Mood and affect are normal. Patient exhibits appropriate insight and judgment. ____________________________________________   LABS (pertinent positives/negatives) Labs Reviewed  WET PREP, GENITAL - Abnormal; Notable for the following:    WBC, Wet Prep HPF POC MODERATE (*)    All other components within normal limits  URINALYSIS COMPLETEWITH MICROSCOPIC ([facility] ONLY) - Abnormal; Notable for the following:    Color, Urine YELLOW (*)    APPearance HAZY (*)    Ketones, ur TRACE (*)    Hgb urine dipstick 2+ (*)    Bacteria, UA RARE (*)    Squamous Epithelial / LPF 6-30 (*)    All other components within normal limits  CHLAMYDIA/NGC RT PCR ([facility] ONLY)  ____________________________________________  INITIAL IMPRESSION / ASSESSMENT AND PLAN / ED COURSE  Patient without clinical or laboratory evidence of a UTI or acute cervicitis. She will be discharged with instruction to continue to monitor symptoms and follow up with her primary care provider as needed. ____________________________________________  FINAL CLINICAL IMPRESSION(S) / ED DIAGNOSES  Final diagnoses:  Dysuria      Lissa Hoard, PA-C 10/22/15 1913  Emily Filbert, MD 10/23/15 [phone]

## 2015-10-22 NOTE — ED Notes (Signed)
Pt to ed with c/o burning with urination and frequency of urination x 1 day.  Denies foul odor to urine.   Pt states she was recently diagnosed with the flu.

## 2015-10-22 NOTE — ED Notes (Signed)
Pt reports she began having dysuria today, pt reports she also thinks she has a yeast infection but denies vaginal itching, reports some vaginal discharge that is different.

## 2015-10-22 NOTE — Discharge Instructions (Signed)

## 2015-12-30 ENCOUNTER — Encounter: Payer: Self-pay | Admitting: *Deleted

## 2015-12-30 ENCOUNTER — Emergency Department
Admission: EM | Admit: 2015-12-30 | Discharge: 2015-12-30 | Disposition: A | Discharge status: Home / Self Care | Payer: Managed Care, Other (non HMO) | Attending: Emergency Medicine | Admitting: Emergency Medicine

## 2015-12-30 DIAGNOSIS — R112 Nausea with vomiting, unspecified: Secondary | ICD-10-CM

## 2015-12-30 DIAGNOSIS — R197 Diarrhea, unspecified: Secondary | ICD-10-CM | POA: Insufficient documentation

## 2015-12-30 DIAGNOSIS — Z792 Long term (current) use of antibiotics: Secondary | ICD-10-CM | POA: Diagnosis not present

## 2015-12-30 DIAGNOSIS — R1084 Generalized abdominal pain: Secondary | ICD-10-CM | POA: Diagnosis not present

## 2015-12-30 DIAGNOSIS — Z79899 Other long term (current) drug therapy: Secondary | ICD-10-CM | POA: Diagnosis not present

## 2015-12-30 DIAGNOSIS — F17210 Nicotine dependence, cigarettes, uncomplicated: Secondary | ICD-10-CM | POA: Insufficient documentation

## 2015-12-30 LAB — URINALYSIS COMPLETE WITH MICROSCOPIC (ARMC ONLY)
BACTERIA UA: NONE SEEN
Bilirubin Urine: NEGATIVE
Glucose, UA: NEGATIVE mg/dL
Hgb urine dipstick: NEGATIVE
KETONES UR: NEGATIVE mg/dL
Nitrite: NEGATIVE
PH: 6 (ref 5.0–8.0)
PROTEIN: 30 mg/dL — AB
Specific Gravity, Urine: 1.024 (ref 1.005–1.030)

## 2015-12-30 LAB — COMPREHENSIVE METABOLIC PANEL
ALT: 17 U/L (ref 14–54)
AST: 18 U/L (ref 15–41)
Albumin: 4 g/dL (ref 3.5–5.0)
Alkaline Phosphatase: 55 U/L (ref 38–126)
Anion gap: 6 (ref 5–15)
BUN: 10 mg/dL (ref 6–20)
CHLORIDE: 109 mmol/L (ref 101–111)
CO2: 24 mmol/L (ref 22–32)
Calcium: 8.9 mg/dL (ref 8.9–10.3)
Creatinine, Ser: 0.84 mg/dL (ref 0.44–1.00)
GLUCOSE: 81 mg/dL (ref 65–99)
POTASSIUM: 3.7 mmol/L (ref 3.5–5.1)
SODIUM: 139 mmol/L (ref 135–145)
Total Bilirubin: 0.9 mg/dL (ref 0.3–1.2)
Total Protein: 7.7 g/dL (ref 6.5–8.1)

## 2015-12-30 LAB — CBC
HEMATOCRIT: 42.9 % (ref 35.0–47.0)
HEMOGLOBIN: 14.3 g/dL (ref 12.0–16.0)
MCH: 29.4 pg (ref 26.0–34.0)
MCHC: 33.3 g/dL (ref 32.0–36.0)
MCV: 88.2 fL (ref 80.0–100.0)
Platelets: 195 10*3/uL (ref 150–440)
RBC: 4.87 MIL/uL (ref 3.80–5.20)
RDW: 14.1 % (ref 11.5–14.5)
WBC: 10.5 10*3/uL (ref 3.6–11.0)

## 2015-12-30 LAB — PREGNANCY, URINE: PREG TEST UR: NEGATIVE

## 2015-12-30 LAB — LIPASE, BLOOD: LIPASE: 22 U/L (ref 11–51)

## 2015-12-30 MED ORDER — ONDANSETRON HCL 4 MG PO TABS: 4.0000 mg | ORAL_TABLET | Freq: Every day | ORAL | Status: DC | PRN

## 2015-12-30 MED ORDER — DICYCLOMINE HCL 20 MG PO TABS: 20.0000 mg | ORAL_TABLET | Freq: Three times a day (TID) | ORAL | Status: DC | PRN

## 2015-12-30 NOTE — ED Notes (Signed)
States 2 days ago she began vomiting with abd pain, now abd pain still present but states diaherra, states she has been able to keep food down

## 2015-12-30 NOTE — Discharge Instructions (Signed)
Abdominal Pain, Adult °Many things can cause belly (abdominal) pain. Most times, the belly pain is not dangerous. Many cases of belly pain can be watched and treated at home. °HOME CARE  °· Do not take medicines that help you go poop (laxatives) unless told to by your doctor. °· Only take medicine as told by your doctor. °· Eat or drink as told by your doctor. Your doctor will tell you if you should be on a special diet. °GET HELP IF: °· You do not know what is causing your belly pain. °· You have belly pain while you are sick to your stomach (nauseous) or have runny poop (diarrhea). °· You have pain while you pee or poop. °· Your belly pain wakes you up at night. °· You have belly pain that gets worse or better when you eat. °· You have belly pain that gets worse when you eat fatty foods. °· You have a fever. °GET HELP RIGHT AWAY IF:  °· The pain does not go away within 2 hours. °· You keep throwing up (vomiting). °· The pain changes and is only in the right or left part of the belly. °· You have bloody or tarry looking poop. °MAKE SURE YOU:  °· Understand these instructions. °· Will watch your condition. °· Will get help right away if you are not doing well or get worse. °  °This information is not intended to replace advice given to you by your health care provider. Make sure you discuss any questions you have with your health care provider. °  °Document Released: 01/17/2008 Document Revised: 08/21/2014 Document Reviewed: 04/09/2013 °Elsevier Interactive Patient Education ©2016 [employer] ° °Diarrhea °Diarrhea is watery poop (stool). It can make you feel weak, tired, thirsty, or give you a dry mouth (signs of dehydration). Watery poop is a sign of another problem, most often an infection. It often lasts 2-3 days. It can last longer if it is a sign of something serious. Take care of yourself as told by your doctor. °HOME CARE  °· Drink 1 cup (8 ounces) of fluid each time you have watery poop. °· Do not drink  the following fluids: °¨ Those that contain simple sugars (fructose, glucose, galactose, lactose, sucrose, maltose). °¨ Sports drinks. °¨ Fruit juices. °¨ Whole milk products. °¨ Sodas. °¨ Drinks with caffeine (coffee, tea, soda) or alcohol. °· Oral rehydration solution may be used if the doctor says it is okay. You may make your own solution. Follow this recipe: °¨  - teaspoon table salt. °¨ ¾ teaspoon baking soda. °¨  teaspoon salt substitute containing potassium chloride. °¨ 1 tablespoons sugar. °¨ 1 liter (34 ounces) of water. °· Avoid the following foods: °¨ High fiber foods, such as raw fruits and vegetables. °¨ Nuts, seeds, and whole grain breads and cereals. °¨  Those that are sweetened with sugar alcohols (xylitol, sorbitol, mannitol). °· Try eating the following foods: °¨ Starchy foods, such as rice, toast, pasta, low-sugar cereal, oatmeal, baked potatoes, crackers, and bagels. °¨ Bananas. °¨ Applesauce. °· Eat probiotic-rich foods, such as yogurt and milk products that are fermented. °· Wash your hands well after each time you have watery poop. °· Only take medicine as told by your doctor. °· Take a warm bath to help lessen burning or pain from having watery poop. °GET HELP RIGHT AWAY IF:  °· You cannot drink fluids without throwing up (vomiting). °· You keep throwing up. °· You have blood in your poop, or your poop looks   black and tarry. °· You do not pee (urinate) in 6-8 hours, or there is only a small amount of very dark pee. °· You have belly (abdominal) pain that gets worse or stays in the same spot (localizes). °· You are weak, dizzy, confused, or light-headed. °· You have a very bad headache. °· Your watery poop gets worse or does not get better. °· You have a fever or lasting symptoms for more than 2-3 days. °· You have a fever and your symptoms suddenly get worse. °MAKE SURE YOU:  °· Understand these instructions. °· Will watch your condition. °· Will get help right away if you are not doing well  or get worse. °  °This information is not intended to replace advice given to you by your health care provider. Make sure you discuss any questions you have with your health care provider. °  °Document Released: 01/17/2008 Document Revised: 08/21/2014 Document Reviewed: 04/07/2012 °Elsevier Interactive Patient Education ©2016 [employer] ° °Nausea and Vomiting °Nausea means you feel sick to your stomach. Throwing up (vomiting) is a reflex where stomach contents come out of your mouth. °HOME CARE  °· Take medicine as told by your doctor. °· Do not force yourself to eat. However, you do need to drink fluids. °· If you feel like eating, eat a normal diet as told by your doctor. °¨ Eat rice, wheat, potatoes, bread, lean meats, yogurt, fruits, and vegetables. °¨ Avoid high-fat foods. °· Drink enough fluids to keep your pee (urine) clear or pale yellow. °· Ask your doctor how to replace body fluid losses (rehydrate). Signs of body fluid loss (dehydration) include: °¨ Feeling very thirsty. °¨ Dry lips and mouth. °¨ Feeling dizzy. °¨ Dark pee. °¨ Peeing less than normal. °¨ Feeling confused. °¨ Fast breathing or heart rate. °GET HELP RIGHT AWAY IF:  °· You have blood in your throw up. °· You have black or bloody poop (stool). °· You have a bad headache or stiff neck. °· You feel confused. °· You have bad belly (abdominal) pain. °· You have chest pain or trouble breathing. °· You do not pee at least once every 8 hours. °· You have cold, clammy skin. °· You keep throwing up after 24 to 48 hours. °· You have a fever. °MAKE SURE YOU:  °· Understand these instructions. °· Will watch your condition. °· Will get help right away if you are not doing well or get worse. °  °This information is not intended to replace advice given to you by your health care provider. Make sure you discuss any questions you have with your health care provider. °  °Document Released: 01/17/2008 Document Revised: 10/23/2011 Document Reviewed:  12/30/2010 °Elsevier Interactive Patient Education ©2016 [employer] ° °

## 2015-12-30 NOTE — ED Notes (Signed)
Intermittent nausea/vomiting s/p eating. Lungs clear. +BS. Denies pain

## 2015-12-30 NOTE — ED Provider Notes (Signed)
[facility] Emergency Department Provider Note   ____________________________________________  Time seen: Approximately 5:17 PM  I have reviewed the triage vital signs and the nursing notes.   HISTORY  Chief Complaint Emesis   HPI Kimberly Arroyo is a 30 y.o. female without any chronic medical conditions was presenting to the emergency department today with diffuse abdominal cramping over the past 3 days. She says that she was previously vomiting but has been able to tolerate food today without any vomiting or nausea. Says that she has had one episode of non-bloody diarrhea today. 2 children at home or sick with ear infections. Denies any pain at this time. Denies any burning or frequency with urination. Says that she was concerned because she felt like she was going to swell or self-harm way to work today so she decided to come to the emergency department instead. She is requesting a work note. Denies any recent antibiotics.   History reviewed. No pertinent past medical history.  There are no active problems to display for this patient.   History reviewed. No pertinent past surgical history.  Current Outpatient Rx  Name  Route  Sig  Dispense  Refill  . brompheniramine-pseudoephedrine-DM 30-2-10 MG/5ML syrup   Oral   Take 5 mLs by mouth 4 (four) times daily as needed.   120 mL   0   . ibuprofen (ADVIL,MOTRIN) 800 MG tablet   Oral   Take 1 tablet (800 mg total) by mouth every 8 (eight) hours as needed for moderate pain.   15 tablet   0   . meloxicam (MOBIC) 15 MG tablet   Oral   Take 1 tablet (15 mg total) by mouth daily.   30 tablet   0   . phenazopyridine (PYRIDIUM) 200 MG tablet   Oral   Take 1 tablet (200 mg total) by mouth 3 (three) times daily as needed for pain.   9 tablet   0   . sulfamethoxazole-trimethoprim (BACTRIM DS,SEPTRA DS) 800-160 MG per tablet   Oral   Take 1 tablet by mouth 2 (two) times daily.   10 tablet   0   .  traMADol (ULTRAM) 50 MG tablet   Oral   Take 1 tablet (50 mg total) by mouth every 6 (six) hours as needed for moderate pain.   12 tablet   0     Allergies Review of patient's allergies indicates no known allergies.  History reviewed. No pertinent family history.  Social History Social History  Substance Use Topics  . Smoking status: Current Every Day Smoker    Types: Cigarettes  . Smokeless tobacco: None  . Alcohol Use: Yes     Comment: occ    Review of Systems Constitutional: No fever/chills Eyes: No visual changes. ENT: No sore throat. Cardiovascular: Denies chest pain. Respiratory: Denies shortness of breath. Gastrointestinal:   No constipation. Genitourinary: Negative for dysuria. Musculoskeletal: Negative for back pain. Skin: Negative for rash. Neurological: Negative for headaches, focal weakness or numbness.  10-point ROS otherwise negative.  ____________________________________________   PHYSICAL EXAM:  VITAL SIGNS: ED Triage Vitals  Enc Vitals Group     BP 12/30/15 1525 126/70 mmHg     Pulse Rate 12/30/15 1525 77     Resp 12/30/15 1525 18     Temp 12/30/15 1525 98.8 F (37.1 C)     Temp Source 12/30/15 1525 Oral     SpO2 12/30/15 1525 98 %     Weight 12/30/15 1525 232 lb (  105.235 kg)     Height 12/30/15 1525 5\' 5"  (1.651 m)     Head Cir --      Peak Flow --      Pain Score --      Pain Loc --      Pain Edu? --      Excl. in GC? --     Constitutional: Alert and oriented. Well appearing and in no acute distress. Eyes: Conjunctivae are normal. PERRL. EOMI. Head: Atraumatic. Nose: No congestion/rhinnorhea. Mouth/Throat: Mucous membranes are moist.   Neck: No stridor.   Cardiovascular: Normal rate, regular rhythm. Grossly normal heart sounds.  Good peripheral circulation. Respiratory: Normal respiratory effort.  No retractions. Lungs CTAB. Gastrointestinal: Soft and nontender. No distention.  No CVA tenderness. Musculoskeletal: No lower  extremity tenderness nor edema.  No joint effusions. Neurologic:  Normal speech and language. No gross focal neurologic deficits are appreciated. No gait instability. Skin:  Skin is warm, dry and intact. No rash noted. Psychiatric: Mood and affect are normal. Speech and behavior are normal.  ____________________________________________   LABS (all labs ordered are listed, but only abnormal results are displayed)  Labs Reviewed  URINALYSIS COMPLETEWITH MICROSCOPIC ([facility] ONLY) - Abnormal; Notable for the following:    Color, Urine YELLOW (*)    APPearance CLEAR (*)    Protein, ur 30 (*)    Leukocytes, UA TRACE (*)    Squamous Epithelial / LPF 6-30 (*)    All other components within normal limits  LIPASE, BLOOD  COMPREHENSIVE METABOLIC PANEL  CBC  PREGNANCY, URINE   ____________________________________________  EKG   ____________________________________________  RADIOLOGY   ____________________________________________   PROCEDURES   ____________________________________________   INITIAL IMPRESSION / ASSESSMENT AND PLAN / ED COURSE  Pertinent labs & imaging results that were available during my care of the patient were reviewed by me and considered in my medical decision making (see chart for details).  Patient likely with viral illness causing her symptoms. Very reassuring lab work as well as exam. We'll give Bentyl and Zofran for symptomatically treatment. As long as pregnancy test is negative the patient will be discharged to home. Explained this plan to the patient and she is understanding willing to comply with the plan. ____________________________________________   FINAL CLINICAL IMPRESSION(S) / ED DIAGNOSES  Diffuse abdominal cramping with nausea vomiting and diarrhea.    NEW MEDICATIONS STARTED DURING THIS VISIT:  New Prescriptions   No medications on file     Note:  This document was prepared using Dragon voice recognition software and may  include unintentional dictation errors.    Myrna Blazeravid Matthew Shyne Lehrke, MD 12/30/15 [phone]

## 2016-01-14 ENCOUNTER — Emergency Department
Admission: EM | Admit: 2016-01-14 | Discharge: 2016-01-14 | Disposition: A | Discharge status: Home / Self Care | Payer: Managed Care, Other (non HMO) | Attending: Student | Admitting: Student

## 2016-01-14 DIAGNOSIS — R350 Frequency of micturition: Secondary | ICD-10-CM | POA: Diagnosis present

## 2016-01-14 DIAGNOSIS — Z79899 Other long term (current) drug therapy: Secondary | ICD-10-CM | POA: Diagnosis not present

## 2016-01-14 DIAGNOSIS — R110 Nausea: Secondary | ICD-10-CM | POA: Diagnosis not present

## 2016-01-14 DIAGNOSIS — F17210 Nicotine dependence, cigarettes, uncomplicated: Secondary | ICD-10-CM | POA: Diagnosis not present

## 2016-01-14 LAB — URINALYSIS COMPLETE WITH MICROSCOPIC (ARMC ONLY)
Bilirubin Urine: NEGATIVE
Glucose, UA: NEGATIVE mg/dL
Ketones, ur: NEGATIVE mg/dL
LEUKOCYTES UA: NEGATIVE
Nitrite: NEGATIVE
PH: 5 (ref 5.0–8.0)
PROTEIN: NEGATIVE mg/dL
Specific Gravity, Urine: 1.018 (ref 1.005–1.030)

## 2016-01-14 LAB — POCT PREGNANCY, URINE: PREG TEST UR: NEGATIVE

## 2016-01-14 LAB — GLUCOSE, CAPILLARY: Glucose-Capillary: 100 mg/dL — ABNORMAL HIGH (ref 65–99)

## 2016-01-14 MED ORDER — PHENAZOPYRIDINE HCL 200 MG PO TABS: 200.0000 mg | ORAL_TABLET | Freq: Three times a day (TID) | ORAL | Status: DC | PRN

## 2016-01-14 NOTE — ED Provider Notes (Signed)
[facility] Emergency Department Provider Note   ____________________________________________  Time seen: Approximately 8:31 AM  I have reviewed the triage vital signs and the nursing notes.   HISTORY  Chief Complaint Urinary Frequency    HPI Kimberly Arroyo is a 30 y.o. female with no chronic medical problems who presents for evaluation of 2 days of increased urinary frequency, gradual onset, constant, no modifying factors, moderate. She denies any vomiting, diarrhea or abdominal pain. She has had mild nausea. No chest pain or difficulty breathing. No fevers or chills. She reports she has a history of urinary tract infections and this feels similar. No flank pain. She denies any abnormal vaginal discharge, she had a small amount of spotting from her vagina last week but she reports that this is common since she had her Mirena placed.   No past medical history on file.  There are no active problems to display for this patient.   No past surgical history on file.  Current Outpatient Rx  Name  Route  Sig  Dispense  Refill  . brompheniramine-pseudoephedrine-DM 30-2-10 MG/5ML syrup   Oral   Take 5 mLs by mouth 4 (four) times daily as needed.   120 mL   0   . dicyclomine (BENTYL) 20 MG tablet   Oral   Take 1 tablet (20 mg total) by mouth 3 (three) times daily as needed for spasms.   15 tablet   0   . ibuprofen (ADVIL,MOTRIN) 800 MG tablet   Oral   Take 1 tablet (800 mg total) by mouth every 8 (eight) hours as needed for moderate pain.   15 tablet   0   . meloxicam (MOBIC) 15 MG tablet   Oral   Take 1 tablet (15 mg total) by mouth daily.   30 tablet   0   . ondansetron (ZOFRAN) 4 MG tablet   Oral   Take 1 tablet (4 mg total) by mouth daily as needed.   10 tablet   0   . phenazopyridine (PYRIDIUM) 200 MG tablet   Oral   Take 1 tablet (200 mg total) by mouth 3 (three) times daily as needed for pain.   9 tablet   0   .  sulfamethoxazole-trimethoprim (BACTRIM DS,SEPTRA DS) 800-160 MG per tablet   Oral   Take 1 tablet by mouth 2 (two) times daily.   10 tablet   0   . traMADol (ULTRAM) 50 MG tablet   Oral   Take 1 tablet (50 mg total) by mouth every 6 (six) hours as needed for moderate pain.   12 tablet   0     Allergies Review of patient's allergies indicates no known allergies.  No family history on file.  Social History Social History  Substance Use Topics  . Smoking status: Current Every Day Smoker    Types: Cigarettes  . Smokeless tobacco: Not on file  . Alcohol Use: Yes     Comment: occ    Review of Systems Constitutional: No fever/chills Eyes: No visual changes. ENT: No sore throat. Cardiovascular: Denies chest pain. Respiratory: Denies shortness of breath. Gastrointestinal: No abdominal pain.  + nausea, no vomiting.  No diarrhea.  No constipation. Genitourinary: Negative for dysuria. +Increased urinary frequency. Musculoskeletal: Negative for back pain. Skin: Negative for rash. Neurological: Negative for headaches, focal weakness or numbness.  10-point ROS otherwise negative.  ____________________________________________   PHYSICAL EXAM:  VITAL SIGNS: ED Triage Vitals  Enc Vitals Group  BP 01/14/16 0804 121/70 mmHg     Pulse Rate 01/14/16 0804 64     Resp 01/14/16 0804 18     Temp 01/14/16 0804 98.9 F (37.2 C)     Temp Source 01/14/16 0804 Oral     SpO2 01/14/16 0804 99 %     Weight 01/14/16 0804 230 lb (104.327 kg)     Height 01/14/16 0804 5\' 5"  (1.651 m)     Head Cir --      Peak Flow --      Pain Score 01/14/16 0805 5     Pain Loc --      Pain Edu? --      Excl. in GC? --     Constitutional: Alert and oriented. Well appearing and in no acute distress. Eyes: Conjunctivae are normal. PERRL. EOMI. Head: Atraumatic. Nose: No congestion/rhinnorhea. Mouth/Throat: Mucous membranes are moist.  Oropharynx non-erythematous. Neck: No stridor.  Supple without  meningismus. Cardiovascular: Normal rate, regular rhythm. Grossly normal heart sounds.  Good peripheral circulation. Respiratory: Normal respiratory effort.  No retractions. Lungs CTAB. Gastrointestinal: Soft and nontender. No distention.  No CVA tenderness. Genitourinary: deferred Musculoskeletal: No lower extremity tenderness nor edema.  No joint effusions. Neurologic:  Normal speech and language. No gross focal neurologic deficits are appreciated. No gait instability. Skin:  Skin is warm, dry and intact. No rash noted. Psychiatric: Mood and affect are normal. Speech and behavior are normal.  ____________________________________________   LABS (all labs ordered are listed, but only abnormal results are displayed)  Labs Reviewed  URINALYSIS COMPLETEWITH MICROSCOPIC ([facility] ONLY) - Abnormal; Notable for the following:    Color, Urine YELLOW (*)    APPearance CLEAR (*)    Hgb urine dipstick 1+ (*)    Bacteria, UA RARE (*)    Squamous Epithelial / LPF 6-30 (*)    All other components within normal limits  GLUCOSE, CAPILLARY - Abnormal; Notable for the following:    Glucose-Capillary 100 (*)    All other components within normal limits  POC URINE PREG, ED  POCT PREGNANCY, URINE   ____________________________________________  EKG  none ____________________________________________  RADIOLOGY  none ____________________________________________   PROCEDURES  Procedure(s) performed: None  Critical Care performed: No  ____________________________________________   INITIAL IMPRESSION / ASSESSMENT AND PLAN / ED COURSE  Pertinent labs & imaging results that were available during my care of the patient were reviewed by me and considered in my medical decision making (see chart for details).  Kimberly Arroyo is a 30 y.o. female with no chronic medical problems who presents for evaluation of 2 days of increased urinary frequency. On exam, she is very well-appearing and in no acute  distress, her vital signs are stable, she is afebrile. She denies any abdominal pain to me and she has a benign abdominal examination, specifically there is no tenderness to palpation in the right upper quadrant or the right lower quadrant. We'll obtain urinalysis, urine pregnancy test and reassess for disposition.  ----------------------------------------- 10:18 AM on 01/14/2016 ----------------------------------------- The patient remains comfortable, sitting up in bed with no complaints. Pregnancy test is negative. Normal glucose, I doubt that this increased urinary frequency represents new onset diabetes. Urinalysis is not consistent with infection. We'll discharge with Pyridium. We discussed  return precautions, need for close PCP follow-up and she is comfortable with the discharge plan. DC home.  ____________________________________________   FINAL CLINICAL IMPRESSION(S) / ED DIAGNOSES  Final diagnoses:  Urinary frequency      NEW MEDICATIONS STARTED DURING  THIS VISIT:  New Prescriptions   No medications on file     Note:  This document was prepared using Dragon voice recognition software and may include unintentional dictation errors.    Gayla Doss, MD 01/14/16 1018

## 2016-01-14 NOTE — ED Notes (Signed)
Pt states that she hasn't felt well for a week, decreased appetite, lower abd pain and urinary frequency, pt denies any medical hx, pt states that she is dressed to go into work, but not feeling well, no distress noted, pt drove self to ER

## 2016-01-17 ENCOUNTER — Encounter: Payer: Self-pay | Admitting: Emergency Medicine

## 2016-01-17 ENCOUNTER — Emergency Department
Admission: EM | Admit: 2016-01-17 | Discharge: 2016-01-17 | Disposition: A | Discharge status: Home / Self Care | Payer: Managed Care, Other (non HMO) | Attending: Emergency Medicine | Admitting: Emergency Medicine

## 2016-01-17 DIAGNOSIS — N760 Acute vaginitis: Secondary | ICD-10-CM | POA: Insufficient documentation

## 2016-01-17 DIAGNOSIS — Z792 Long term (current) use of antibiotics: Secondary | ICD-10-CM | POA: Insufficient documentation

## 2016-01-17 DIAGNOSIS — Z79899 Other long term (current) drug therapy: Secondary | ICD-10-CM | POA: Insufficient documentation

## 2016-01-17 DIAGNOSIS — F17210 Nicotine dependence, cigarettes, uncomplicated: Secondary | ICD-10-CM | POA: Diagnosis not present

## 2016-01-17 DIAGNOSIS — N898 Other specified noninflammatory disorders of vagina: Secondary | ICD-10-CM | POA: Diagnosis present

## 2016-01-17 LAB — URINALYSIS COMPLETE WITH MICROSCOPIC (ARMC ONLY)
BACTERIA UA: NONE SEEN
Bilirubin Urine: NEGATIVE
GLUCOSE, UA: NEGATIVE mg/dL
HGB URINE DIPSTICK: NEGATIVE
Ketones, ur: NEGATIVE mg/dL
Leukocytes, UA: NEGATIVE
NITRITE: NEGATIVE
PH: 7 (ref 5.0–8.0)
PROTEIN: NEGATIVE mg/dL
SPECIFIC GRAVITY, URINE: 1.024 (ref 1.005–1.030)
WBC UA: NONE SEEN WBC/hpf (ref 0–5)

## 2016-01-17 LAB — WET PREP, GENITAL
Clue Cells Wet Prep HPF POC: NONE SEEN
SPERM: NONE SEEN
TRICH WET PREP: NONE SEEN
YEAST WET PREP: NONE SEEN

## 2016-01-17 LAB — CHLAMYDIA/NGC RT PCR (ARMC ONLY)
Chlamydia Tr: NOT DETECTED
N GONORRHOEAE: NOT DETECTED

## 2016-01-17 NOTE — Discharge Instructions (Signed)
Vaginitis Vaginitis is an inflammation of the vagina. It can happen when the normal bacteria and yeast in the vagina grow too much. There are different types. Treatment will depend on the type you have. HOME CARE  Take all medicines as told by your doctor.  Keep your vagina area clean and dry. Avoid soap. Rinse the area with water.  Avoid washing and cleaning out the vagina (douching).  Do not use tampons or have sex (intercourse) until your treatment is done.  Wipe from front to back after going to the restroom.  Wear cotton underwear.  Avoid wearing underwear while you sleep until your vaginitis is gone.  Avoid tight pants. Avoid underwear or nylons without a cotton panel.  Take off wet clothing (such as a bathing suit) as soon as you can.  Use mild, unscented products. Avoid fabric softeners and scented:  Feminine sprays.  Laundry detergents.  Tampons.  Soaps or bubble baths.  Practice safe sex and use condoms. GET HELP RIGHT AWAY IF:   You have belly (abdominal) pain.  You have a fever or lasting symptoms for more than 2-3 days.  You have a fever and your symptoms suddenly get worse. MAKE SURE YOU:   Understand these instructions.  Will watch this condition.  Will get help right away if you are not doing well or get worse.   This information is not intended to replace advice given to you by your health care provider. Make sure you discuss any questions you have with your health care provider.   Document Released: 10/27/2008 Document Revised: 04/24/2012 Document Reviewed: 01/11/2012 Elsevier Interactive Patient Education [employer]2016 [employer]  Your exam is normal. Your labs do not show any signs of infection. Follow-up with your provider as scheduled. Your other test is pending.

## 2016-01-17 NOTE — ED Notes (Signed)
Vaginal odor x 1 day, denies discharge or itching.

## 2016-01-17 NOTE — ED Provider Notes (Signed)
[facility] Emergency Department Provider Note ____________________________________________  Time seen: 1415  I have reviewed the triage vital signs and the nursing notes.  HISTORY  Chief Complaint  Vaginal Discharge  HPI Kimberly Arroyo is a 30 y.o. female resisted ED for evaluation of a malodorous vaginal discharge for the last day. She denies any abnormal vaginal bleeding, abdominal pain, pelvic pain. She also denies any recent Sexual encounter. She uses Mirena for birth control and otherwise denies any fevers, chills, sweats. He did treated for a dysuria withPyridium.  History reviewed. No pertinent past medical history.  There are no active problems to display for this patient.   History reviewed. No pertinent past surgical history.  Current Outpatient Rx  Name  Route  Sig  Dispense  Refill  . brompheniramine-pseudoephedrine-DM 30-2-10 MG/5ML syrup   Oral   Take 5 mLs by mouth 4 (four) times daily as needed.   120 mL   0   . dicyclomine (BENTYL) 20 MG tablet   Oral   Take 1 tablet (20 mg total) by mouth 3 (three) times daily as needed for spasms.   15 tablet   0   . ibuprofen (ADVIL,MOTRIN) 800 MG tablet   Oral   Take 1 tablet (800 mg total) by mouth every 8 (eight) hours as needed for moderate pain.   15 tablet   0   . meloxicam (MOBIC) 15 MG tablet   Oral   Take 1 tablet (15 mg total) by mouth daily.   30 tablet   0   . ondansetron (ZOFRAN) 4 MG tablet   Oral   Take 1 tablet (4 mg total) by mouth daily as needed.   10 tablet   0   . phenazopyridine (PYRIDIUM) 200 MG tablet   Oral   Take 1 tablet (200 mg total) by mouth 3 (three) times daily as needed for pain.   9 tablet   0   . phenazopyridine (PYRIDIUM) 200 MG tablet   Oral   Take 1 tablet (200 mg total) by mouth 3 (three) times daily as needed for pain.   6 tablet   0   . sulfamethoxazole-trimethoprim (BACTRIM DS,SEPTRA DS) 800-160 MG per tablet   Oral   Take 1 tablet  by mouth 2 (two) times daily.   10 tablet   0   . traMADol (ULTRAM) 50 MG tablet   Oral   Take 1 tablet (50 mg total) by mouth every 6 (six) hours as needed for moderate pain.   12 tablet   0    Allergies Review of patient's allergies indicates no known allergies.  No family history on file.  Social History Social History  Substance Use Topics  . Smoking status: Current Every Day Smoker -- 0.50 packs/day    Types: Cigarettes  . Smokeless tobacco: None  . Alcohol Use: Yes     Comment: occ   Review of Systems  Constitutional: Negative for fever. Gastrointestinal: Negative for abdominal pain, vomiting and diarrhea. Genitourinary: Negative for dysuria. Vaginal discharge.  Musculoskeletal: Negative for back pain. Skin: Negative for rash. Neurological: Negative for headaches, focal weakness or numbness. ____________________________________________  PHYSICAL EXAM:  VITAL SIGNS: ED Triage Vitals  Enc Vitals Group     BP 01/17/16 1342 146/78 mmHg     Pulse Rate 01/17/16 1342 70     Resp 01/17/16 1342 18     Temp 01/17/16 1342 98.5 F (36.9 C)     Temp Source 01/17/16 1342 Oral  SpO2 01/17/16 1342 98 %     Weight 01/17/16 1342 230 lb (104.327 kg)     Height 01/17/16 1342 5\' 5"  (1.651 m)     Head Cir --      Peak Flow --      Pain Score --      Pain Loc --      Pain Edu? --      Excl. in GC? --    Constitutional: Alert and oriented. Well appearing and in no distress. Head: Normocephalic and atraumatic. Respiratory: Normal respiratory effort.  Gastrointestinal: Soft and nontender. No distention. GU: Normal vaginal genitalia. Patient with a scant, white discharge noted in the vagina. No cervical motion tenderness Musculoskeletal: Nontender with normal range of motion in all extremities.  Skin:  Skin is warm, dry and intact. No rash noted. ____________________________________________   LABS (pertinent positives/negatives) Labs Reviewed  WET PREP, GENITAL -  Abnormal; Notable for the following:    WBC, Wet Prep HPF POC MODERATE (*)    All other components within normal limits  URINALYSIS COMPLETEWITH MICROSCOPIC ([facility] ONLY) - Abnormal; Notable for the following:    Color, Urine YELLOW (*)    APPearance CLEAR (*)    Squamous Epithelial / LPF 0-5 (*)    All other components within normal limits  CHLAMYDIA/NGC RT PCR ([facility] ONLY)  ____________________________________________  INITIAL IMPRESSION / ASSESSMENT AND PLAN / ED COURSE  Patient with a vaginitis without laboratory confirmation of any yeast, BV, trichomoniasis. Her GC culture is pending at the time of discharge, but exam is not suspicious for any acute cervicitis. She will continue with the previously prescribed Pyridium and follow-up with her primary care provider as scheduled in 2 days. ____________________________________________  FINAL CLINICAL IMPRESSION(S) / ED DIAGNOSES  Final diagnoses:  Vaginitis     Lissa HoardJenise V Bacon Verleen Stuckey, PA-C 01/17/16 1545  Arnaldo NatalPaul F Malinda, MD 01/18/16 2157

## 2016-02-23 ENCOUNTER — Emergency Department
Admission: EM | Admit: 2016-02-23 | Discharge: 2016-02-24 | Disposition: A | Discharge status: Home / Self Care | Payer: Managed Care, Other (non HMO) | Attending: Emergency Medicine | Admitting: Emergency Medicine

## 2016-02-23 DIAGNOSIS — F329 Major depressive disorder, single episode, unspecified: Secondary | ICD-10-CM | POA: Insufficient documentation

## 2016-02-23 DIAGNOSIS — F411 Generalized anxiety disorder: Secondary | ICD-10-CM

## 2016-02-23 DIAGNOSIS — Z5181 Encounter for therapeutic drug level monitoring: Secondary | ICD-10-CM | POA: Insufficient documentation

## 2016-02-23 DIAGNOSIS — R45851 Suicidal ideations: Secondary | ICD-10-CM | POA: Insufficient documentation

## 2016-02-23 DIAGNOSIS — Z046 Encounter for general psychiatric examination, requested by authority: Secondary | ICD-10-CM | POA: Diagnosis present

## 2016-02-23 DIAGNOSIS — F321 Major depressive disorder, single episode, moderate: Secondary | ICD-10-CM | POA: Diagnosis not present

## 2016-02-23 DIAGNOSIS — F17210 Nicotine dependence, cigarettes, uncomplicated: Secondary | ICD-10-CM | POA: Diagnosis not present

## 2016-02-23 HISTORY — DX: Depression, unspecified: F32.A

## 2016-02-23 HISTORY — DX: Major depressive disorder, single episode, unspecified: F32.9

## 2016-02-23 LAB — CBC
HCT: 43.3 % (ref 35.0–47.0)
HEMOGLOBIN: 14.9 g/dL (ref 12.0–16.0)
MCH: 29.8 pg (ref 26.0–34.0)
MCHC: 34.3 g/dL (ref 32.0–36.0)
MCV: 86.8 fL (ref 80.0–100.0)
PLATELETS: 199 10*3/uL (ref 150–440)
RBC: 5 MIL/uL (ref 3.80–5.20)
RDW: 13.5 % (ref 11.5–14.5)
WBC: 13.3 10*3/uL — ABNORMAL HIGH (ref 3.6–11.0)

## 2016-02-23 LAB — URINE DRUG SCREEN, QUALITATIVE (ARMC ONLY)
Amphetamines, Ur Screen: NOT DETECTED
BARBITURATES, UR SCREEN: NOT DETECTED
BENZODIAZEPINE, UR SCRN: NOT DETECTED
Cannabinoid 50 Ng, Ur ~~LOC~~: POSITIVE — AB
Cocaine Metabolite,Ur ~~LOC~~: NOT DETECTED
MDMA (Ecstasy)Ur Screen: NOT DETECTED
METHADONE SCREEN, URINE: NOT DETECTED
Opiate, Ur Screen: POSITIVE — AB
Phencyclidine (PCP) Ur S: NOT DETECTED
TRICYCLIC, UR SCREEN: NOT DETECTED

## 2016-02-23 LAB — COMPREHENSIVE METABOLIC PANEL
ALK PHOS: 64 U/L (ref 38–126)
ALT: 15 U/L (ref 14–54)
ANION GAP: 6 (ref 5–15)
AST: 17 U/L (ref 15–41)
Albumin: 4.2 g/dL (ref 3.5–5.0)
BUN: 14 mg/dL (ref 6–20)
CALCIUM: 9.2 mg/dL (ref 8.9–10.3)
CO2: 26 mmol/L (ref 22–32)
Chloride: 105 mmol/L (ref 101–111)
Creatinine, Ser: 0.79 mg/dL (ref 0.44–1.00)
GFR calc non Af Amer: >60 mL/min (ref >60)
Glucose, Bld: 97 mg/dL (ref 65–99)
Potassium: 3.8 mmol/L (ref 3.5–5.1)
SODIUM: 137 mmol/L (ref 135–145)
TOTAL PROTEIN: 8.2 g/dL — AB (ref 6.5–8.1)
Total Bilirubin: 0.4 mg/dL (ref 0.3–1.2)

## 2016-02-23 LAB — SALICYLATE LEVEL

## 2016-02-23 LAB — ACETAMINOPHEN LEVEL

## 2016-02-23 LAB — POCT PREGNANCY, URINE: PREG TEST UR: NEGATIVE

## 2016-02-23 LAB — TSH: TSH: 1.313 u[IU]/mL (ref 0.350–4.500)

## 2016-02-23 LAB — ETHANOL

## 2016-02-23 NOTE — ED Notes (Signed)
ED BHU PLACEMENT JUSTIFICATION Is the patient under IVC or is there intent for IVC: No. Is the patient medically cleared: Yes.   Is there vacancy in the ED BHU: Yes.   Is the population mix appropriate for patient: Yes.   Is the patient awaiting placement in inpatient or outpatient setting: No. Has the patient had a psychiatric consult: No. Survey of unit performed for contraband, proper placement and condition of furniture, tampering with fixtures in bathroom, shower, and each patient room: Yes.  ; Findings: NA APPEARANCE/BEHAVIOR calm, cooperative and adequate rapport can be established NEURO ASSESSMENT Orientation: time, place and person Hallucinations: No.None noted (Hallucinations) Speech: Normal Gait: normal RESPIRATORY ASSESSMENT Normal expansion.  Clear to auscultation.  No rales, rhonchi, or wheezing. CARDIOVASCULAR ASSESSMENT regular rate and rhythm, S1, S2 normal, no murmur, click, rub or gallop GASTROINTESTINAL ASSESSMENT soft, nontender, BS WNL, no r/g EXTREMITIES normal strength, tone, and muscle mass PLAN OF CARE Provide calm/safe environment. Vital signs assessed twice daily. ED BHU Assessment once each 12-hour shift. Collaborate with intake RN daily or as condition indicates. Assure the ED provider has rounded once each shift. Provide and encourage hygiene. Provide redirection as needed. Assess for escalating behavior; address immediately and inform ED provider.  Assess family dynamic and appropriateness for visitation as needed: Yes.  ; If necessary, describe findings: NA Educate the patient/family about BHU procedures/visitation: Yes.  ; If necessary, describe findings: NA  

## 2016-02-23 NOTE — ED Notes (Signed)
Pt dressed out and belonging secured, pt has shoes, cell phone, jeans, shirt and bra on arrival.

## 2016-02-23 NOTE — ED Provider Notes (Signed)
Time Seen: Approximately *1610 I have reviewed the triage notes  Chief Complaint: Psychiatric Evaluation   History of Present Illness: Kimberly Arroyo is a 30 y.o. female who presents with some suicidal thoughts. Patient states that she has no plan to harm herself and she's had this now off and on she states for "" couple of months "". Patient went for counseling yesterday and has planned follow-up tomorrow with a counselor. She is not currently on any medication. She states in the past that in her use she was a cutter and had some suicidal thoughts at that time but otherwise has done well in adult.. She denies any homicidal plans. His had some transient homicidal thoughts. She denies any hallucinations. Patient has no physical complaints.*She is not currently on any psychiatric medications.   Past Medical History  Diagnosis Date  . Depression     There are no active problems to display for this patient.   History reviewed. No pertinent past surgical history.  History reviewed. No pertinent past surgical history.  Current Outpatient Rx  Name  Route  Sig  Dispense  Refill  . brompheniramine-pseudoephedrine-DM 30-2-10 MG/5ML syrup   Oral   Take 5 mLs by mouth 4 (four) times daily as needed.   120 mL   0   . dicyclomine (BENTYL) 20 MG tablet   Oral   Take 1 tablet (20 mg total) by mouth 3 (three) times daily as needed for spasms.   15 tablet   0   . ibuprofen (ADVIL,MOTRIN) 800 MG tablet   Oral   Take 1 tablet (800 mg total) by mouth every 8 (eight) hours as needed for moderate pain.   15 tablet   0   . meloxicam (MOBIC) 15 MG tablet   Oral   Take 1 tablet (15 mg total) by mouth daily.   30 tablet   0   . ondansetron (ZOFRAN) 4 MG tablet   Oral   Take 1 tablet (4 mg total) by mouth daily as needed.   10 tablet   0   . phenazopyridine (PYRIDIUM) 200 MG tablet   Oral   Take 1 tablet (200 mg total) by mouth 3 (three) times daily as needed for pain.   9 tablet   0    . phenazopyridine (PYRIDIUM) 200 MG tablet   Oral   Take 1 tablet (200 mg total) by mouth 3 (three) times daily as needed for pain.   6 tablet   0   . sulfamethoxazole-trimethoprim (BACTRIM DS,SEPTRA DS) 800-160 MG per tablet   Oral   Take 1 tablet by mouth 2 (two) times daily.   10 tablet   0   . traMADol (ULTRAM) 50 MG tablet   Oral   Take 1 tablet (50 mg total) by mouth every 6 (six) hours as needed for moderate pain.   12 tablet   0     Allergies:  Review of patient's allergies indicates no known allergies.  Family History: No family history on file.  Social History: Social History  Substance Use Topics  . Smoking status: Current Every Day Smoker -- 0.50 packs/day    Types: Cigarettes  . Smokeless tobacco: None  . Alcohol Use: Yes     Comment: occ     Review of Systems:   10 point review of systems was performed and was otherwise negative:  Constitutional: No fever Eyes: No visual disturbances ENT: No sore throat, ear pain Cardiac: No chest pain Respiratory: No shortness  of breath, wheezing, or stridor Abdomen: No abdominal pain, no vomiting, No diarrhea Endocrine: No weight loss, No night sweats Extremities: No peripheral edema, cyanosis Skin: No rashes, easy bruising Neurologic: No focal weakness, trouble with speech or swollowing Urologic: No dysuria, Hematuria, or urinary frequency   Physical Exam:  ED Triage Vitals  Enc Vitals Group     BP 02/23/16 1448 129/71 mmHg     Pulse Rate 02/23/16 1448 62     Resp 02/23/16 1448 18     Temp 02/23/16 1448 98.1 F (36.7 C)     Temp Source 02/23/16 1448 Oral     SpO2 02/23/16 1448 99 %     Weight 02/23/16 1448 241 lb (109.317 kg)     Height 02/23/16 1448  (1.651 m)     Head Cir --      Peak Flow --      Pain Score --      Pain Loc --      Pain Edu? --      Excl. in GC? --     General: Awake , Alert , and Oriented times 3; GCS 15 Head: Normal cephalic , atraumatic Eyes: Pupils equal ,  round, reactive to light Nose/Throat: No nasal drainage, patent upper airway without erythema or exudate.  Neck: Supple, Full range of motion, No anterior adenopathy or palpable thyroid masses Lungs: Clear to ascultation without wheezes , rhonchi, or rales Heart: Regular rate, regular rhythm without murmurs , gallops , or rubs Abdomen: Soft, non tender without rebound, guarding , or rigidity; bowel sounds positive and symmetric in all 4 quadrants. No organomegaly .        Extremities: 2 plus symmetric pulses. No edema, clubbing or cyanosis Neurologic: normal ambulation, Motor symmetric without deficits, sensory intact Skin: warm, dry, no rashes   Labs:   All laboratory work was reviewed including any pertinent negatives or positives listed below:  Labs Reviewed  COMPREHENSIVE METABOLIC PANEL - Abnormal; Notable for the following:    Total Protein 8.2 (*)    All other components within normal limits  ACETAMINOPHEN LEVEL - Abnormal; Notable for the following:    Acetaminophen (Tylenol), Serum <10 (*)    All other components within normal limits  CBC - Abnormal; Notable for the following:    WBC 13.3 (*)    All other components within normal limits  ETHANOL  SALICYLATE LEVEL  URINE DRUG SCREEN, QUALITATIVE ([facility] ONLY)  POCT PREGNANCY, URINE   Insert normal labs with TSH is pending at this time  ED Course:  Patient's stay here was uneventful at this point she is currently here voluntarily. I do not find her as a high suicide risk. She has consultation placed for psychiatric and TTS evaluation. Currently medically cleared    Assessment: * Suicidal ideation   F   Plan:  Psychiatric consultation            Jennye Moccasin, MD 02/23/16 [phone]

## 2016-02-23 NOTE — ED Notes (Signed)
Pt. To BHU from ED ambulatory without difficulty, to room  BHU-5. Report from State Street CorporationVanessa RN. Pt. Is alert and oriented, warm and dry in no distress. Pt. Denies SI, HI, and AVH. Patient reports having increased depression and that she would never hurt herself because of her children. Pt. Calm and cooperative. Pt. Made aware of security cameras and Q15 minute rounds. Pt. Encouraged to let Nursing staff know of any concerns or needs.

## 2016-02-23 NOTE — ED Notes (Signed)

## 2016-02-23 NOTE — ED Notes (Addendum)
Pt states she is having SI thoughts, ,denies any injury to self PTA.Marland Kitchen. Pt is a/ox3 on arrival.. Pt states she would never hurt herself because she needs to be there for her kids.. Pt grandmother is here and states she will pick the children up from daycare..Marland Kitchen

## 2016-02-23 NOTE — BH Assessment (Signed)
Tele Assessment Note   Kimberly Arroyo is an 30 y.o. female, who presents to [facility] with some suicidal thoughts. Patient states that she has no plan to harm herself and she's had this now off and on she states for "" couple of months "". Patient went for counseling yesterday and has planned follow-up tomorrow with a counselor. She is not currently on any medication. She states in the past that in her use she was a cutter and had some suicidal thoughts at that time but otherwise has done well in adult.. Patient states her primary concern is of recent increase in depression and SI [no plan]. Patient states her children live with her at home age 95, 4, 6 x 2 girls, x 1 boy. Patient denies history of psychotic symptoms. Patient states that she has had decline in sleep with reports of 4 hours or less of sleep off and on with not being able to dream.   Patient acknowledges current SI with no plan, and denies history of SI , but reports distant past history of cutting self and "going numb." Patient denies current or past hx. Of HI or AVH. Patient denies history of substance abuse or history of inpatient and outpatient psychiatric care. However, pt states that she is seeing [facility] Most recently and is seeking a therapist  Diagnosis: Major Depressive Disorder  Past Medical History:  Past Medical History  Diagnosis Date  . Depression     History reviewed. No pertinent past surgical history.  Family History: No family history on file.  Social History:  reports that she has been smoking Cigarettes.  She has been smoking about 0.50 packs per day. She does not have any smokeless tobacco history on file. She reports that she drinks alcohol. She reports that she does not use illicit drugs.  Additional Social History:  Alcohol / Drug Use Pain Medications: SEE MAR Prescriptions: SEE MAR Over the Counter: SEE MAR History of alcohol / drug use?: No history of alcohol / drug abuse  CIWA:  CIWA-Ar BP: 129/71 mmHg Pulse Rate: 62 COWS:    PATIENT STRENGTHS: (choose at least two) Active sense of humor Average or above average intelligence Capable of independent living   Allergies: No Known Allergies  Home Medications:  (Not in a hospital admission)  OB/GYN Status:  No LMP recorded. Patient is not currently having periods (Reason: IUD).  General Assessment Data Location of Assessment: [facility] ED TTS Assessment: In system Is this a Tele or Face-to-Face Assessment?: Face-to-Face Is this an Initial Assessment or a Re-assessment for this encounter?: Initial Assessment Marital status: Single Maiden name: n/a Is patient pregnant?: No Pregnancy Status: No Living Arrangements: Children Can pt return to current living arrangement?: Yes Admission Status: Voluntary Is patient capable of signing voluntary admission?: Yes Referral Source: Self/Family/Friend Insurance type: Designer, industrial/product Exam [facility] Walk-in ONLY) Medical Exam completed: Yes  Crisis Care Plan Living Arrangements: Children Name of Psychiatrist: [facility] Name of Therapist: unspecified  Education Status Is patient currently in school?: No Current Grade: n/a Highest grade of school patient has completed: n/a Name of school: n/a Contact person: Rondel Baton Grandmother [phone]  Risk to self with the past 6 months Suicidal Ideation: Yes-Currently Present Has patient been a risk to self within the past 6 months prior to admission? : No Suicidal Intent: No Has patient had any suicidal intent within the past 6 months prior to admission? : No Is patient at risk for suicide?: Yes Suicidal Plan?: No  Has patient had any suicidal plan within the past 6 months prior to admission? : No Access to Means: No What has been your use of drugs/alcohol within the last 12 months?: none Previous Attempts/Gestures: No How many times?: 0 Other Self Harm Risks: past hx. of cutting no  current Triggers for Past Attempts: Unknown Intentional Self Injurious Behavior: None Family Suicide History: Yes Recent stressful life event(s): Conflict (Comment), Turmoil (Comment) Persecutory voices/beliefs?: No Depression: Yes Depression Symptoms: Despondent, Insomnia, Tearfulness, Isolating, Fatigue, Guilt, Loss of interest in usual pleasures, Feeling worthless/self pity, Feeling angry/irritable Substance abuse history and/or treatment for substance abuse?: No Suicide prevention information given to non-admitted patients: Yes  Risk to Others within the past 6 months Homicidal Ideation: No Does patient have any lifetime risk of violence toward others beyond the six months prior to admission? : No Thoughts of Harm to Others: No Current Homicidal Intent: No Current Homicidal Plan: No Access to Homicidal Means: No Identified Victim: none History of harm to others?: No Assessment of Violence: None Noted Violent Behavior Description: n/a Does patient have access to weapons?: No Criminal Charges Pending?: No Does patient have a court date: Yes Court Date: 04/07/16 (traffic ticket) Is patient on probation?: No  Psychosis Hallucinations: None noted Delusions: None noted  Mental Status Report Appearance/Hygiene: In scrubs Eye Contact: Good Motor Activity: Restlessness, Unsteady Speech: Unremarkable Level of Consciousness: Unable to assess Mood: Depressed, Anxious Affect: Depressed Anxiety Level: Severe Thought Processes: Coherent, Relevant Judgement: Partial Orientation: Person, Place, Time, Situation, Appropriate for developmental age Obsessive Compulsive Thoughts/Behaviors: Moderate  Cognitive Functioning Concentration: Normal Memory: Recent Intact, Remote Intact IQ: Average Insight: Fair Impulse Control: Poor Appetite: Fair Weight Loss: 0 Weight Gain: 10 Sleep: Decreased Total Hours of Sleep: 4 Vegetative Symptoms: None  ADLScreening [facility] Assessment  Services) Patient's cognitive ability adequate to safely complete daily activities?: Yes Patient able to express need for assistance with ADLs?: Yes Independently performs ADLs?: Yes (appropriate for developmental age)  Prior Inpatient Therapy Prior Inpatient Therapy: No Prior Therapy Dates: n/a Prior Therapy Facilty/Provider(s): no Reason for Treatment: n/a  Prior Outpatient Therapy Prior Outpatient Therapy: No Prior Therapy Dates: n/a Prior Therapy Facilty/Provider(s): n/a Reason for Treatment: n/a Does patient have an ACCT team?: No Does patient have Intensive In-House Services?  : No Does patient have [facility] services? : No Does patient have P4CC services?: No  ADL Screening (condition at time of admission) Patient's cognitive ability adequate to safely complete daily activities?: Yes Is the patient deaf or have difficulty hearing?: No Does the patient have difficulty seeing, even when wearing glasses/contacts?: No Does the patient have difficulty concentrating, remembering, or making decisions?: No Patient able to express need for assistance with ADLs?: Yes Does the patient have difficulty dressing or bathing?: No Independently performs ADLs?: Yes (appropriate for developmental age) Does the patient have difficulty walking or climbing stairs?: No Weakness of Legs: None Weakness of Arms/Hands: None  Home Assistive Devices/Equipment Home Assistive Devices/Equipment: None    Abuse/Neglect Assessment (Assessment to be complete while patient is alone) Physical Abuse: Denies Verbal Abuse: Denies Sexual Abuse: Denies Exploitation of patient/patient's resources: Denies Self-Neglect: Denies Possible abuse reported to:: [place] department of social services Values / Beliefs Cultural Requests During Hospitalization: None Spiritual Requests During Hospitalization: None   Advance Directives (For Healthcare) Does patient have an advance directive?: No Would patient like  information on creating an advanced directive?: No - patient declined information    Additional Information 1:1 In Past 12 Months?: No CIRT Risk: No  Elopement Risk: No Does patient have medical clearance?: Yes     Disposition:  Disposition Initial Assessment Completed for this Encounter: Yes Disposition of Patient: Other dispositions Other disposition(s):  (TBD)  Hipolito Bayley 02/23/2016 5:54 PM

## 2016-02-24 DIAGNOSIS — F411 Generalized anxiety disorder: Secondary | ICD-10-CM

## 2016-02-24 DIAGNOSIS — Z046 Encounter for general psychiatric examination, requested by authority: Secondary | ICD-10-CM

## 2016-02-24 DIAGNOSIS — F321 Major depressive disorder, single episode, moderate: Secondary | ICD-10-CM | POA: Diagnosis not present

## 2016-02-24 NOTE — ED Notes (Signed)

## 2016-02-24 NOTE — Discharge Instructions (Signed)
Suicidal Feelings: How to Help Yourself °Suicide is the taking of one's own life. If you feel as though life is getting too tough to handle and are thinking about suicide, get help right away. To get help: °· Call your local emergency services (911 in the [place] °· Call a suicide hotline to speak with a trained counselor who understands how you are feeling. The following is a list of suicide hotlines in the [place]. For a list of hotlines in [place], visit www.suicide.org/hotlines/international/[place]-suicide-hotlines.html. °¨  1-800-273-TALK (1-800-273-8255). °¨  1-800-SUICIDE (1-800-784-2433). °¨  1-888-628-9454. This is a hotline for Spanish speakers. °¨  1-800-799-4TTY (1-800-799-4889). This is a hotline for TTY users. °¨  1-866-4-U-TREVOR (1-866-488-7386). This is a hotline for lesbian, gay, bisexual, transgender, or questioning youth. °· Contact a crisis center or a local suicide prevention center. To find a crisis center or suicide prevention center: °¨ Call your local hospital, clinic, community service organization, mental health center, social service provider, or health department. Ask for assistance in connecting to a crisis center. °¨ Visit www.suicidepreventionlifeline.org/getinvolved/locator for a list of crisis centers in the [place], or visit www.suicideprevention.[place]/thinking-about-suicide/find-a-crisis-centre for a list of centers in [place]. °· Visit the following websites: °¨  National Suicide Prevention Lifeline: www.suicidepreventionlifeline.org °¨  Hopeline: www.hopeline.com °¨  American Foundation for Suicide Prevention: www.afsp.org °¨  The Trevor Project (for lesbian, gay, bisexual, transgender, or questioning youth): www.thetrevorproject.org °HOW CAN I HELP MYSELF FEEL BETTER? °· Promise yourself that you will not do anything drastic when you have suicidal feelings. Remember, there is hope. Many people have gotten through suicidal thoughts and feelings, and you will, too. You may  have gotten through them before, and this proves that you can get through them again. °· Let family, friends, teachers, or counselors know how you are feeling. Try not to isolate yourself from those who care about you. Remember, they will want to help you. Talk with someone every day, even if you do not feel sociable. Face-to-face conversation is best. °· Call a mental health professional and see one regularly. °· Visit your primary health care provider every year. °· Eat a well-balanced diet, and space your meals so you eat regularly. °· Get plenty of rest. °· Avoid alcohol and drugs, and remove them from your home. They will only make you feel worse. °· If you are thinking of taking a lot of medicine, give your medicine to someone who can give it to you one day at a time. If you are on antidepressants and are concerned you will overdose, let your health care provider know so he or she can give you safer medicines. Ask your mental health professional about the possible side effects of any medicines you are taking. °· Remove weapons, poisons, knives, and anything else that could harm you from your home. °· Try to stick to routines. Follow a schedule every day. Put self-care on your schedule. °· Make a list of realistic goals, and cross them off when you achieve them. Accomplishments give a sense of worth. °· Wait until you are feeling better before doing the things you find difficult or unpleasant. °· Exercise if you are able. You will feel better if you exercise for even a half hour each day. °· Go out in the sun or into nature. This will help you recover from depression faster. If you have a favorite place to walk, go there. °· Do the things that have always given you pleasure. Play your favorite music, read a good book, paint a picture, play your favorite instrument, or do anything   else that takes your mind off your depression if it is safe to do.  Keep your living space well lit.  When you are feeling well,  write yourself a letter about tips and support that you can read when you are not feeling well.  Remember that life's difficulties can be sorted out with help. Conditions can be treated. You can work on thoughts and strategies that serve you well.   This information is not intended to replace advice given to you by your health care provider. Make sure you discuss any questions you have with your health care provider.   Document Released: 02/04/2003 Document Revised: 08/21/2014 Document Reviewed: 11/25/2013 Elsevier Interactive Patient Education [employer]2016 [employer]vier Inc.  Please return at once for any further problems. Please follow-up with [facility] the contact information is unable sheet of paper which were given

## 2016-02-24 NOTE — ED Provider Notes (Signed)
Patient seen by Dr. Mat Carnelay packs and cleared. He reports she has not suicidal at present. We'll be safe with follow-up with [facility].  Arnaldo NatalPaul F Malinda, MD 02/24/16 [phone]

## 2016-02-24 NOTE — ED Notes (Signed)
Snack tray provided at this time. 

## 2016-02-24 NOTE — ED Notes (Signed)
Patient took a shower. Maintained on 15 minute checks and observation by security camera for safety. 

## 2016-02-24 NOTE — ED Notes (Signed)
Patient sitting in dayroom interacting with peers. No noted distress or abnormal behaviors noted. Patient denies SI/HI.Will continue 15 minute checks and observation by security camera for safety.

## 2016-02-24 NOTE — ED Notes (Signed)
Patient provided with belongings to get dressed at this time.

## 2016-02-24 NOTE — ED Notes (Signed)
VOL/Consult completed/ Pending D/C  

## 2016-02-24 NOTE — ED Notes (Signed)
Patient awake, alert, and oriented. Still reports depressed mood but feels safe in the hospital. Waiting for psych evaluation. Maintained on 15 minute checks and observation by security camera for safety.

## 2016-02-24 NOTE — ED Notes (Signed)
Patient resting quietly in room. No noted distress or abnormal behaviors noted. Will continue 15 minute checks and observation by security camera for safety. 

## 2016-02-24 NOTE — Consult Note (Signed)
[facility] Face-to-Face Psychiatry Consult   Reason for Consult:  Consult for this 30 year old woman who presented to the hospital complaining of depression and suicidal thoughts. Referring Physician:  Cinda Quest Patient Identification: Sharnese Heath MRN:  [id number] Principal Diagnosis: Depression, major, single episode, moderate ([place]) Diagnosis:   Patient Active Problem List   Diagnosis Date Noted  . Depression, major, single episode, moderate ([place]) [F32.1] 02/24/2016  . Generalized anxiety disorder [F41.1] 02/24/2016  . Involuntary commitment [Z04.6] 02/24/2016    Total Time spent with patient: 1 hour  Subjective:   Henli Hey is a 30 y.o. female patient admitted with "I've been having more thoughts about killing myself".  HPI:  Patient interviewed. Chart reviewed. Labs and vitals reviewed. Patient reports that for the past 2 months her mood is been feeling worse. She's been feeling more down and depressed. Negative thoughts. Tired a lot of the time. Not sleeping very well. Patient has chronic anxiety symptoms which have continued but the depression has been worse recently. The biggest negative in her life she can think of recently is that her grandparents are dying which she finds depressing. On the other hand the patient says she feels certain she would not act to kill her self. Has no plan of doing anything to kill her self. She has 3 young children at home and that's a major reason for her not to try to harm herself. She is not currently seeing any kind of mental health provider and not taking any medicine. Denies regular alcohol use or drug abuse.   medical history: Denies any significant medical problems  Social history: Living with her 3 young children. Does have some extended family in the area. Working full-time at the [employer].  Substance abuse history: Says she drinks only occasionally denies other drug abuse denies any past alcohol or drug abuse problems  Past Psychiatric History:  Patient says she has seen psychiatrists in the past. About 6 years ago while pregnant she was having depression and saw a psychiatrist. She was put on some medication but does not remember what it was. She didn't like the effects of it and stopped it and never went back for treatment. No history of inpatient hospital treatment. She used to cut herself when she was an adolescent but has not done any self-harm since then. No history of violence.  Risk to Self: Suicidal Ideation: Yes-Currently Present Suicidal Intent: No Is patient at risk for suicide?: Yes Suicidal Plan?: No Access to Means: No What has been your use of drugs/alcohol within the last 12 months?: none How many times?: 0 Other Self Harm Risks: past hx. of cutting no current Triggers for Past Attempts: Unknown Intentional Self Injurious Behavior: None Risk to Others: Homicidal Ideation: No Thoughts of Harm to Others: No Current Homicidal Intent: No Current Homicidal Plan: No Access to Homicidal Means: No Identified Victim: none History of harm to others?: No Assessment of Violence: None Noted Violent Behavior Description: n/a Does patient have access to weapons?: No Criminal Charges Pending?: No Does patient have a court date: Yes Court Date: 04/07/16 (traffic ticket) Prior Inpatient Therapy: Prior Inpatient Therapy: No Prior Therapy Dates: n/a Prior Therapy Facilty/Provider(s): no Reason for Treatment: n/a Prior Outpatient Therapy: Prior Outpatient Therapy: No Prior Therapy Dates: n/a Prior Therapy Facilty/Provider(s): n/a Reason for Treatment: n/a Does patient have an ACCT team?: No Does patient have Intensive In-House Services?  : No Does patient have [facility] services? : No Does patient have P4CC services?: No  Past Medical History:  Past Medical History  Diagnosis Date  . Depression    History reviewed. No pertinent past surgical history. Family History: No family history on file. Family Psychiatric   History: Patient says her brother has schizophrenia and has made a suicide attempt Social History:  History  Alcohol Use  . Yes    Comment: occ     History  Drug Use No    Social History   Social History  . Marital Status: Single    Spouse Name: N/A  . Number of Children: N/A  . Years of Education: N/A   Social History Main Topics  . Smoking status: Current Every Day Smoker -- 0.50 packs/day    Types: Cigarettes  . Smokeless tobacco: None  . Alcohol Use: Yes     Comment: occ  . Drug Use: No  . Sexual Activity: Not Asked   Other Topics Concern  . None   Social History Narrative   Additional Social History:    Allergies:  No Known Allergies  Labs:  Results for orders placed or performed during the hospital encounter of 02/23/16 (from the past 48 hour(s))  Comprehensive metabolic panel     Status: Abnormal   Collection Time: 02/23/16  2:53 PM  Result Value Ref Range   Sodium 137 135 - 145 mmol/L   Potassium 3.8 3.5 - 5.1 mmol/L   Chloride 105 101 - 111 mmol/L   CO2 26 22 - 32 mmol/L   Glucose, Bld 97 65 - 99 mg/dL   BUN 14 6 - 20 mg/dL   Creatinine, Ser 0.79 0.44 - 1.00 mg/dL   Calcium 9.2 8.9 - 10.3 mg/dL   Total Protein 8.2 (H) 6.5 - 8.1 g/dL   Albumin 4.2 3.5 - 5.0 g/dL   AST 17 15 - 41 U/L   ALT 15 14 - 54 U/L   Alkaline Phosphatase 64 38 - 126 U/L   Total Bilirubin 0.4 0.3 - 1.2 mg/dL   GFR calc non Af Amer >60 >60 mL/min   GFR calc Af Amer >60 >60 mL/min    Comment: (NOTE) The eGFR has been calculated using the CKD EPI equation. This calculation has not been validated in all clinical situations. eGFR's persistently <60 mL/min signify possible Chronic Kidney Disease.    Anion gap 6 5 - 15  Ethanol     Status: None   Collection Time: 02/23/16  2:53 PM  Result Value Ref Range   Alcohol, Ethyl (B) <5 <5 mg/dL    Comment:        LOWEST DETECTABLE LIMIT FOR SERUM ALCOHOL IS 5 mg/dL FOR MEDICAL PURPOSES ONLY   Salicylate level     Status: None    Collection Time: 02/23/16  2:53 PM  Result Value Ref Range   Salicylate Lvl <3.5 2.8 - 30.0 mg/dL  Acetaminophen level     Status: Abnormal   Collection Time: 02/23/16  2:53 PM  Result Value Ref Range   Acetaminophen (Tylenol), Serum <10 (L) 10 - 30 ug/mL    Comment:        THERAPEUTIC CONCENTRATIONS VARY SIGNIFICANTLY. A RANGE OF 10-30 ug/mL MAY BE AN EFFECTIVE CONCENTRATION FOR MANY PATIENTS. HOWEVER, SOME ARE BEST TREATED AT CONCENTRATIONS OUTSIDE THIS RANGE. ACETAMINOPHEN CONCENTRATIONS >150 ug/mL AT 4 HOURS AFTER INGESTION AND >50 ug/mL AT 12 HOURS AFTER INGESTION ARE OFTEN ASSOCIATED WITH TOXIC REACTIONS.   cbc     Status: Abnormal   Collection Time: 02/23/16  2:53 PM  Result Value Ref Range  WBC 13.3 (H) 3.6 - 11.0 K/uL   RBC 5.00 3.80 - 5.20 MIL/uL   Hemoglobin 14.9 12.0 - 16.0 g/dL   HCT 43.3 35.0 - 47.0 %   MCV 86.8 80.0 - 100.0 fL   MCH 29.8 26.0 - 34.0 pg   MCHC 34.3 32.0 - 36.0 g/dL   RDW 13.5 11.5 - 14.5 %   Platelets 199 150 - 440 K/uL  Urine Drug Screen, Qualitative     Status: Abnormal   Collection Time: 02/23/16  2:53 PM  Result Value Ref Range   Tricyclic, Ur Screen NONE DETECTED NONE DETECTED   Amphetamines, Ur Screen NONE DETECTED NONE DETECTED   MDMA (Ecstasy)Ur Screen NONE DETECTED NONE DETECTED   Cocaine Metabolite,Ur [place] NONE DETECTED NONE DETECTED   Opiate, Ur Screen POSITIVE (A) NONE DETECTED   Phencyclidine (PCP) Ur S NONE DETECTED NONE DETECTED   Cannabinoid 50 Ng, Ur [place] POSITIVE (A) NONE DETECTED   Barbiturates, Ur Screen NONE DETECTED NONE DETECTED   Benzodiazepine, Ur Scrn NONE DETECTED NONE DETECTED   Methadone Scn, Ur NONE DETECTED NONE DETECTED    Comment: (NOTE) 716  Tricyclics, urine               Cutoff 1000 ng/mL 200  Amphetamines, urine             Cutoff 1000 ng/mL 300  MDMA (Ecstasy), urine           Cutoff 500 ng/mL 400  Cocaine Metabolite, urine       Cutoff 300 ng/mL 500  Opiate, urine                   Cutoff 300  ng/mL 600  Phencyclidine (PCP), urine      Cutoff 25 ng/mL 700  Cannabinoid, urine              Cutoff 50 ng/mL 800  Barbiturates, urine             Cutoff 200 ng/mL 900  Benzodiazepine, urine           Cutoff 200 ng/mL 1000 Methadone, urine                Cutoff 300 ng/mL 1100 1200 The urine drug screen provides only a preliminary, unconfirmed 1300 analytical test result and should not be used for non-medical 1400 purposes. Clinical consideration and professional judgment should 1500 be applied to any positive drug screen result due to possible 1600 interfering substances. A more specific alternate chemical method 1700 must be used in order to obtain a confirmed analytical result.  1800 Gas chromato graphy / mass spectrometry (GC/MS) is the preferred 1900 confirmatory method.   TSH     Status: None   Collection Time: 02/23/16  2:53 PM  Result Value Ref Range   TSH 1.313 0.350 - 4.500 uIU/mL  Pregnancy, urine POC     Status: None   Collection Time: 02/23/16  3:01 PM  Result Value Ref Range   Preg Test, Ur NEGATIVE NEGATIVE    Comment:        THE SENSITIVITY OF THIS METHODOLOGY IS >24 mIU/mL     No current facility-administered medications for this encounter.   No current outpatient prescriptions on file.    Musculoskeletal: Strength & Muscle Tone: within normal limits Gait & Station: normal Patient leans: N/A  Psychiatric Specialty Exam: Physical Exam  Nursing note and vitals reviewed. Constitutional: She appears well-developed and well-nourished.  HENT:  Head: Normocephalic and atraumatic.  Eyes: Conjunctivae are normal. Pupils are equal, round, and reactive to light.  Neck: Normal range of motion.  Cardiovascular: Regular rhythm and normal heart sounds.   Respiratory: Effort normal. No respiratory distress.  GI: Soft.  Musculoskeletal: Normal range of motion.  Neurological: She is alert.  Skin: Skin is warm and dry.  Psychiatric: Her speech is normal and  behavior is normal. Cognition and memory are normal. She expresses impulsivity. She exhibits a depressed mood. She expresses suicidal ideation. She expresses no suicidal plans.    Review of Systems  Constitutional: Negative.   HENT: Negative.   Eyes: Negative.   Respiratory: Negative.   Cardiovascular: Negative.   Gastrointestinal: Negative.   Musculoskeletal: Negative.   Skin: Negative.   Neurological: Negative.   Psychiatric/Behavioral: Positive for depression and suicidal ideas. Negative for hallucinations, memory loss and substance abuse. The patient is nervous/anxious and has insomnia.     Blood pressure 114/63, pulse 74, temperature 98.1 F (36.7 C), temperature source Oral, resp. rate 16, height _0  (1.651 m), weight 109.317 kg (241 lb), SpO2 100 %.Body mass index is 40.1 kg/(m^2).  General Appearance: Disheveled  Eye Contact:  Fair  Speech:  Clear and Coherent  Volume:  Decreased  Mood:  Depressed  Affect:  Congruent  Thought Process:  Goal Directed  Orientation:  Full (Time, Place, and Person)  Thought Content:  Logical  Suicidal Thoughts:  Yes.  without intent/plan  Homicidal Thoughts:  No  Memory:  Immediate;   Good Recent;   Fair Remote;   Fair  Judgement:  Fair  Insight:  Fair  Psychomotor Activity:  Decreased  Concentration:  Concentration: Fair  Recall:  [place] of Knowledge:  Good  Language:  Fair  Akathisia:  No  Handed:  Right  AIMS (if indicated):     Assets:  Communication Skills Desire for Improvement Financial Resources/Insurance Housing Social Support  ADL's:  Intact  Cognition:  WNL  Sleep:        Treatment Plan Summary: Plan 30 year old woman reports symptoms of depression and has had some passive suicidal thoughts but has not acted on them. Denies any intention or plan to harm herself. She is requesting that she be discharged. She is in agreement with going to outpatient treatment in the community. She will be given information about  [facility] and strongly encouraged follow-up with outpatient psychiatric treatment. Because of her history of poor tolerance of antidepressants we will not start any medication at this time. Case reviewed with the ER doctor. IVC discontinued.  Disposition: Patient does not meet criteria for psychiatric inpatient admission. Supportive therapy provided about ongoing stressors.  Alethia Berthold, MD 02/24/2016 5:25 PM

## 2016-02-24 NOTE — ED Notes (Signed)
Patient in dayroom, social with other peers. Meal tray given. 

## 2016-02-24 NOTE — ED Notes (Signed)
Patient calm and cooperative. Maintained on 15 minute checks and observation by security camera for safety.

## 2016-02-24 NOTE — ED Notes (Signed)
Patient requesting note for her employer for yesterday and today. Pending receipt of note to discharge patient at this time.

## 2016-03-16 ENCOUNTER — Emergency Department: Payer: Managed Care, Other (non HMO)

## 2016-03-16 ENCOUNTER — Emergency Department
Admission: EM | Admit: 2016-03-16 | Discharge: 2016-03-16 | Disposition: A | Discharge status: Home / Self Care | Payer: Managed Care, Other (non HMO) | Attending: Emergency Medicine | Admitting: Emergency Medicine

## 2016-03-16 ENCOUNTER — Encounter: Payer: Self-pay | Admitting: Emergency Medicine

## 2016-03-16 DIAGNOSIS — F329 Major depressive disorder, single episode, unspecified: Secondary | ICD-10-CM | POA: Diagnosis not present

## 2016-03-16 DIAGNOSIS — G43909 Migraine, unspecified, not intractable, without status migrainosus: Secondary | ICD-10-CM | POA: Diagnosis present

## 2016-03-16 DIAGNOSIS — R51 Headache: Secondary | ICD-10-CM | POA: Diagnosis not present

## 2016-03-16 DIAGNOSIS — R519 Headache, unspecified: Secondary | ICD-10-CM

## 2016-03-16 DIAGNOSIS — F17210 Nicotine dependence, cigarettes, uncomplicated: Secondary | ICD-10-CM | POA: Insufficient documentation

## 2016-03-16 DIAGNOSIS — F32A Depression, unspecified: Secondary | ICD-10-CM

## 2016-03-16 LAB — CBC WITH DIFFERENTIAL/PLATELET
Basophils Absolute: 0.1 10*3/uL (ref 0–0.1)
Basophils Relative: 1 %
EOS ABS: 0.2 10*3/uL (ref 0–0.7)
EOS PCT: 2 %
HCT: 43 % (ref 35.0–47.0)
HEMOGLOBIN: 14.7 g/dL (ref 12.0–16.0)
LYMPHS ABS: 2.7 10*3/uL (ref 1.0–3.6)
LYMPHS PCT: 24 %
MCH: 30 pg (ref 26.0–34.0)
MCHC: 34.3 g/dL (ref 32.0–36.0)
MCV: 87.5 fL (ref 80.0–100.0)
MONOS PCT: 7 %
Monocytes Absolute: 0.8 10*3/uL (ref 0.2–0.9)
Neutro Abs: 7.4 10*3/uL — ABNORMAL HIGH (ref 1.4–6.5)
Neutrophils Relative %: 66 %
Platelets: 230 10*3/uL (ref 150–440)
RBC: 4.91 MIL/uL (ref 3.80–5.20)
RDW: 13.4 % (ref 11.5–14.5)
WBC: 11.3 10*3/uL — ABNORMAL HIGH (ref 3.6–11.0)

## 2016-03-16 LAB — COMPREHENSIVE METABOLIC PANEL
ALK PHOS: 63 U/L (ref 38–126)
ALT: 14 U/L (ref 14–54)
ANION GAP: 6 (ref 5–15)
AST: 17 U/L (ref 15–41)
Albumin: 4.4 g/dL (ref 3.5–5.0)
BUN: 14 mg/dL (ref 6–20)
CALCIUM: 9 mg/dL (ref 8.9–10.3)
CO2: 29 mmol/L (ref 22–32)
Chloride: 105 mmol/L (ref 101–111)
Creatinine, Ser: 0.73 mg/dL (ref 0.44–1.00)
Glucose, Bld: 99 mg/dL (ref 65–99)
Potassium: 4 mmol/L (ref 3.5–5.1)
SODIUM: 140 mmol/L (ref 135–145)
TOTAL PROTEIN: 8.4 g/dL — AB (ref 6.5–8.1)
Total Bilirubin: 0.6 mg/dL (ref 0.3–1.2)

## 2016-03-16 LAB — URINALYSIS COMPLETE WITH MICROSCOPIC (ARMC ONLY)
BACTERIA UA: NONE SEEN
BILIRUBIN URINE: NEGATIVE
GLUCOSE, UA: NEGATIVE mg/dL
HGB URINE DIPSTICK: NEGATIVE
KETONES UR: NEGATIVE mg/dL
LEUKOCYTES UA: NEGATIVE
NITRITE: NEGATIVE
Protein, ur: NEGATIVE mg/dL
SPECIFIC GRAVITY, URINE: 1.029 (ref 1.005–1.030)
pH: 6 (ref 5.0–8.0)

## 2016-03-16 LAB — POCT PREGNANCY, URINE: Preg Test, Ur: NEGATIVE

## 2016-03-16 MED ORDER — DIPHENHYDRAMINE HCL 50 MG/ML IJ SOLN
25.0000 mg | Freq: Once | INTRAMUSCULAR | Status: AC
  Administered 2016-03-16: 25 mg via INTRAVENOUS

## 2016-03-16 MED ORDER — BUTALBITAL-APAP-CAFFEINE 50-325-40 MG PO TABS: 1.0000 | ORAL_TABLET | Freq: Four times a day (QID) | ORAL | 0 refills | Status: AC | PRN

## 2016-03-16 MED ORDER — LORAZEPAM 2 MG/ML IJ SOLN
0.5000 mg | Freq: Once | INTRAMUSCULAR | Status: AC
  Administered 2016-03-16: 0.5 mg via INTRAVENOUS

## 2016-03-16 MED ORDER — LORAZEPAM 2 MG/ML IJ SOLN
INTRAMUSCULAR | Status: AC
  Filled 2016-03-16: qty 1

## 2016-03-16 MED ORDER — DIPHENHYDRAMINE HCL 50 MG/ML IJ SOLN
INTRAMUSCULAR | Status: AC
  Filled 2016-03-16: qty 1

## 2016-03-16 MED ORDER — METOCLOPRAMIDE HCL 5 MG/ML IJ SOLN
INTRAMUSCULAR | Status: AC
  Filled 2016-03-16: qty 2

## 2016-03-16 MED ORDER — KETOROLAC TROMETHAMINE 30 MG/ML IJ SOLN
30.0000 mg | Freq: Once | INTRAMUSCULAR | Status: AC
  Administered 2016-03-16: 30 mg via INTRAVENOUS

## 2016-03-16 MED ORDER — METOCLOPRAMIDE HCL 5 MG/ML IJ SOLN
10.0000 mg | Freq: Once | INTRAMUSCULAR | Status: AC
  Administered 2016-03-16: 10 mg via INTRAVENOUS

## 2016-03-16 MED ORDER — KETOROLAC TROMETHAMINE 30 MG/ML IJ SOLN
INTRAMUSCULAR | Status: AC
  Filled 2016-03-16: qty 1

## 2016-03-16 MED ORDER — SODIUM CHLORIDE 0.9 % IV SOLN
Freq: Once | INTRAVENOUS | Status: AC
  Administered 2016-03-16: 18:00:00 via INTRAVENOUS
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
## 2016-05-29 ENCOUNTER — Emergency Department
Admission: EM | Admit: 2016-05-29 | Discharge: 2016-05-29 | Disposition: A | Discharge status: Home / Self Care | Payer: Self-pay | Attending: Emergency Medicine | Admitting: Emergency Medicine

## 2016-05-29 ENCOUNTER — Emergency Department: Payer: Self-pay

## 2016-05-29 ENCOUNTER — Encounter: Payer: Self-pay | Admitting: Emergency Medicine

## 2016-05-29 DIAGNOSIS — Y999 Unspecified external cause status: Secondary | ICD-10-CM | POA: Insufficient documentation

## 2016-05-29 DIAGNOSIS — W294XXA Contact with nail gun, initial encounter: Secondary | ICD-10-CM | POA: Insufficient documentation

## 2016-05-29 DIAGNOSIS — Z23 Encounter for immunization: Secondary | ICD-10-CM | POA: Insufficient documentation

## 2016-05-29 DIAGNOSIS — R52 Pain, unspecified: Secondary | ICD-10-CM

## 2016-05-29 DIAGNOSIS — F17210 Nicotine dependence, cigarettes, uncomplicated: Secondary | ICD-10-CM | POA: Insufficient documentation

## 2016-05-29 DIAGNOSIS — Y92009 Unspecified place in unspecified non-institutional (private) residence as the place of occurrence of the external cause: Secondary | ICD-10-CM | POA: Insufficient documentation

## 2016-05-29 DIAGNOSIS — S91332A Puncture wound without foreign body, left foot, initial encounter: Secondary | ICD-10-CM | POA: Insufficient documentation

## 2016-05-29 DIAGNOSIS — Y939 Activity, unspecified: Secondary | ICD-10-CM | POA: Insufficient documentation

## 2016-05-29 MED ORDER — AMOXICILLIN-POT CLAVULANATE 875-125 MG PO TABS: 1.0000 | ORAL_TABLET | Freq: Two times a day (BID) | ORAL | 0 refills | Status: DC

## 2016-05-29 MED ORDER — TETANUS-DIPHTH-ACELL PERTUSSIS 5-2.5-18.5 LF-MCG/0.5 IM SUSP
0.5000 mL | Freq: Once | INTRAMUSCULAR | Status: AC
  Administered 2016-05-29: 0.5 mL via INTRAMUSCULAR
  Filled 2016-05-29: qty 0.5

## 2016-05-29 NOTE — ED Triage Notes (Signed)
Reports stepping on a nail today.  Puncture to right foot.

## 2016-05-29 NOTE — ED Provider Notes (Signed)
[facility] Emergency Department Provider Note  ____________________________________________  Time seen: Approximately 7:41 PM  I have reviewed the triage vital signs and the nursing notes.   HISTORY  Chief Complaint Puncture Wound    HPI Kimberly DoffingShayla Arroyo is a 30 y.o. female who presents emergency department complaining of a puncture wound to the ball of the left foot. Patient states that she was barefooted when she accidentally stepped on a rusty nail at her house. Patient states that her last tetanus shot was greater 5 years ago. Patient was concerned that there may be foreign material in her foot. No other injury or complaint. No medications prior to arrival.   Past Medical History:  Diagnosis Date  . Depression     Patient Active Problem List   Diagnosis Date Noted  . Depression, major, single episode, moderate (HCC) 02/24/2016  . Generalized anxiety disorder 02/24/2016  . Involuntary commitment 02/24/2016    History reviewed. No pertinent surgical history.  Prior to Admission medications   Medication Sig Start Date End Date Taking? Authorizing Provider  amoxicillin-clavulanate (AUGMENTIN) 875-125 MG tablet Take 1 tablet by mouth 2 (two) times daily. 05/29/16   Delorise RoyalsJonathan D Cuthriell, PA-C  butalbital-acetaminophen-caffeine (FIORICET) [phone] MG tablet Take 1-2 tablets by mouth every 6 (six) hours as needed for headache. 03/16/16 03/16/17  Emily FilbertJonathan E Williams, MD    Allergies Review of patient's allergies indicates no known allergies.  No family history on file.  Social History Social History  Substance Use Topics  . Smoking status: Current Every Day Smoker    Packs/day: 0.50    Types: Cigarettes  . Smokeless tobacco: Never Used  . Alcohol use Yes     Comment: occ     Review of Systems  Constitutional: No fever/chills Cardiovascular: no chest pain. Respiratory: no cough. No SOB. Musculoskeletal: Positive for puncture wound to the plantar  aspect of the left foot Skin: Negative for rash, abrasions, lacerations, ecchymosis. Neurological: Negative for headaches, focal weakness or numbness. 10-point ROS otherwise negative.  ____________________________________________   PHYSICAL EXAM:  VITAL SIGNS: ED Triage Vitals  Enc Vitals Group     BP 05/29/16 1831 (!) 116/51     Pulse Rate 05/29/16 1831 90     Resp 05/29/16 1831 18     Temp 05/29/16 1831 98 F (36.7 C)     Temp Source 05/29/16 1831 Oral     SpO2 05/29/16 1831 98 %     Weight 05/29/16 1832 240 lb (108.9 kg)     Height 05/29/16 1832 5\' 5"  (1.651 m)     Head Circumference --      Peak Flow --      Pain Score 05/29/16 1832 10     Pain Loc --      Pain Edu? --      Excl. in GC? --      Constitutional: Alert and oriented. Well appearing and in no acute distress. Eyes: Conjunctivae are normal. PERRL. EOMI. Head: Atraumatic. Cardiovascular: Normal rate, regular rhythm. Normal S1 and S2.  Good peripheral circulation. Respiratory: Normal respiratory effort without tachypnea or retractions. Lungs CTAB. Good air entry to the bases with no decreased or absent breath sounds. Musculoskeletal: Full range of motion to all extremities. No gross deformities appreciated. Neurologic:  Normal speech and language. No gross focal neurologic deficits are appreciated.  Skin:  Skin is warm, dry and intact. No rash noted.Closure wound noted to the plantar aspect left foot. This is in the metatarsal range of  the lateral foot. No visible foreign body. No bleeding. Area is tender to palpation. Full range of motion all digits left foot. Sensation intact 5 digits. Cap refill intact 5 digits. Psychiatric: Mood and affect are normal. Speech and behavior are normal. Patient exhibits appropriate insight and judgement.   ____________________________________________   LABS (all labs ordered are listed, but only abnormal results are displayed)  Labs Reviewed - No data to  display ____________________________________________  EKG   ____________________________________________  RADIOLOGY Festus Barren Cuthriell, personally viewed and evaluated these images (plain radiographs) as part of my medical decision making, as well as reviewing the written report by the radiologist.  Dg Foot Complete Left  Result Date: 05/29/2016 CLINICAL DATA:  Stepped on a nail today, puncture wound at plantar aspect of LEFT foot near 3rd MTP joint, initial encounter EXAM: LEFT FOOT - COMPLETE 3+ VIEW COMPARISON:  None FINDINGS: Osseous mineralization normal. Joint spaces preserved. No fracture, dislocation, or bone destruction. No radiopaque foreign body or soft tissue gas. Mild soft tissue swelling at the level of the distal metatarsals above at plantar and dorsal aspects. IMPRESSION: No acute osseous abnormalities. Soft tissue swelling at forefoot. Electronically Signed   By: Ulyses Southward M.D.   On: 05/29/2016 19:18    ____________________________________________    PROCEDURES  Procedure(s) performed:    Procedures  The wound is cleansed as much as possible, and dressed. The patient is alerted to watch for any signs of infection (redness, pus, pain, increased swelling or fever) and call if such occurs. Home wound care instructions are provided. Tetanus vaccination status reviewed: Td vaccination indicated and given today.   Medications  Tdap (BOOSTRIX) injection 0.5 mL (0.5 mLs Intramuscular Given 05/29/16 1950)     ____________________________________________   INITIAL IMPRESSION / ASSESSMENT AND PLAN / ED COURSE  Pertinent labs & imaging results that were available during my care of the patient were reviewed by me and considered in my medical decision making (see chart for details).  Review of the [place] CSRS was performed in accordance of the [facility] prior to dispensing any controlled drugs.  Clinical Course    Patient's diagnosis is consistent with Puncture wound  to the left foot. X-ray was obtained which revealed no fractures or retained foreign body. Wound was cleansed and dressed at this time.. Patient will be discharged home with prescriptions for antibiotics prophylactically. Patient is to follow up with primary care as needed or otherwise directed. Patient is given ED precautions to return to the ED for any worsening or new symptoms.     ____________________________________________  FINAL CLINICAL IMPRESSION(S) / ED DIAGNOSES  Final diagnoses:  Puncture wound of left foot, initial encounter      NEW MEDICATIONS STARTED DURING THIS VISIT:  Discharge Medication List as of 05/29/2016  7:42 PM    START taking these medications   Details  amoxicillin-clavulanate (AUGMENTIN) 875-125 MG tablet Take 1 tablet by mouth 2 (two) times daily., Starting Mon 05/29/2016, Print            This chart was dictated using voice recognition software/Dragon. Despite best efforts to proofread, errors can occur which can change the meaning. Any change was purely unintentional.    Racheal Patches, PA-C 05/29/16 2002    Sharyn Creamer, MD 05/30/16 [phone]

## 2016-06-25 ENCOUNTER — Emergency Department
Admission: EM | Admit: 2016-06-25 | Discharge: 2016-06-25 | Disposition: A | Discharge status: Home / Self Care | Payer: Managed Care, Other (non HMO) | Attending: Emergency Medicine | Admitting: Emergency Medicine

## 2016-06-25 ENCOUNTER — Encounter: Payer: Self-pay | Admitting: Emergency Medicine

## 2016-06-25 DIAGNOSIS — F17210 Nicotine dependence, cigarettes, uncomplicated: Secondary | ICD-10-CM | POA: Insufficient documentation

## 2016-06-25 DIAGNOSIS — R350 Frequency of micturition: Secondary | ICD-10-CM

## 2016-06-25 LAB — URINALYSIS COMPLETE WITH MICROSCOPIC (ARMC ONLY)
BILIRUBIN URINE: NEGATIVE
GLUCOSE, UA: NEGATIVE mg/dL
HGB URINE DIPSTICK: NEGATIVE
Ketones, ur: NEGATIVE mg/dL
Leukocytes, UA: NEGATIVE
NITRITE: NEGATIVE
Protein, ur: NEGATIVE mg/dL
Specific Gravity, Urine: 1.019 (ref 1.005–1.030)
pH: 6 (ref 5.0–8.0)

## 2016-06-25 LAB — POCT PREGNANCY, URINE: PREG TEST UR: NEGATIVE

## 2016-06-25 LAB — GLUCOSE, CAPILLARY: Glucose-Capillary: 84 mg/dL (ref 65–99)

## 2016-06-25 NOTE — ED Provider Notes (Addendum)
[facility] Emergency Department Provider Note  ____________________________________________   I have reviewed the triage vital signs and the nursing notes.   HISTORY  Chief Complaint Urinary Tract Infection    HPI Kimberly Arroyo is a 30 y.o. female who presents today complaining of urinary frequency. She has no other complaint. No flank pain no nausea no vomiting or chest pain shortness of breath no fever no chills. No abdominal pain. She states she may be drinking more.    Past Medical History:  Diagnosis Date  . Depression     Patient Active Problem List   Diagnosis Date Noted  . Depression, major, single episode, moderate (HCC) 02/24/2016  . Generalized anxiety disorder 02/24/2016  . Involuntary commitment 02/24/2016    History reviewed. No pertinent surgical history.  Prior to Admission medications   Medication Sig Start Date End Date Taking? Authorizing Provider  amoxicillin-clavulanate (AUGMENTIN) 875-125 MG tablet Take 1 tablet by mouth 2 (two) times daily. 05/29/16   Delorise RoyalsJonathan D Cuthriell, PA-C  butalbital-acetaminophen-caffeine (FIORICET) [phone] MG tablet Take 1-2 tablets by mouth every 6 (six) hours as needed for headache. 03/16/16 03/16/17  Emily FilbertJonathan E Williams, MD    Allergies Patient has no known allergies.  History reviewed. No pertinent family history.  Social History Social History  Substance Use Topics  . Smoking status: Current Every Day Smoker    Packs/day: 0.50    Types: Cigarettes  . Smokeless tobacco: Never Used  . Alcohol use Yes     Comment: occ    Review of Systems Constitutional: No fever/chills Eyes: No visual changes. ENT: No sore throat. No stiff neck no neck pain Cardiovascular: Denies chest pain. Respiratory: Denies shortness of breath. Gastrointestinal:   no vomiting.  No diarrhea.  No constipation. Genitourinary: Negative for dysuria. Musculoskeletal: Negative lower extremity swelling Skin: Negative  for rash. Neurological: Negative for severe headaches, focal weakness or numbness. 10-point ROS otherwise negative.  ____________________________________________   PHYSICAL EXAM:  VITAL SIGNS: ED Triage Vitals  Enc Vitals Group     BP 06/25/16 0750 136/78     Pulse Rate 06/25/16 0750 70     Resp 06/25/16 0750 16     Temp 06/25/16 0750 97.5 F (36.4 C)     Temp Source 06/25/16 0750 Oral     SpO2 06/25/16 0750 99 %     Weight 06/25/16 0752 240 lb (108.9 kg)     Height 06/25/16 0752 5\' 5"  (1.651 m)     Head Circumference --      Peak Flow --      Pain Score 06/25/16 0753 0     Pain Loc --      Pain Edu? --      Excl. in GC? --     Constitutional: Alert and oriented. Well appearing and in no acute distress. Eyes: Conjunctivae are normal. PERRL. EOMI. Head: Atraumatic. Nose: No congestion/rhinnorhea. Mouth/Throat: Mucous membranes are moist.  Oropharynx non-erythematous. Neck: No stridor.   Nontender with no meningismus Cardiovascular: Normal rate, regular rhythm. Grossly normal heart sounds.  Good peripheral circulation. Respiratory: Normal respiratory effort.  No retractions. Lungs CTAB. Abdominal: Soft and nontender. No distention. No guarding no rebound Back:  There is no focal tenderness or step off.  there is no midline tenderness there are no lesions noted. there is no CVA tenderness Musculoskeletal: No lower extremity tenderness, no upper extremity tenderness. No joint effusions, no DVT signs strong distal pulses no edema Neurologic:  Normal speech and language. No gross  focal neurologic deficits are appreciated.  Skin:  Skin is warm, dry and intact. No rash noted. Psychiatric: Mood and affect are normal. Speech and behavior are normal.  ____________________________________________   LABS (all labs ordered are listed, but only abnormal results are displayed)  Labs Reviewed  URINALYSIS COMPLETEWITH MICROSCOPIC ([facility] ONLY)  POCT PREGNANCY, URINE  POC URINE PREG,  ED   ____________________________________________  EKG  I personally interpreted any EKGs ordered by me or triage  ____________________________________________  RADIOLOGY  I reviewed any imaging ordered by me or triage that were performed during my shift and, if possible, patient and/or family made aware of any abnormal findings. ____________________________________________   PROCEDURES  Procedure(s) performed: None  Procedures  Critical Care performed: None  ____________________________________________   INITIAL IMPRESSION / ASSESSMENT AND PLAN / ED COURSE  Pertinent labs & imaging results that were available during my care of the patient were reviewed by me and considered in my medical decision making (see chart for details).  Patient here complaining of urinary frequency. We'll check her sugar and a urine and reassess. No other symptoms.  ----------------------------------------- 8:42 AM on 06/25/2016 -----------------------------------------  No evidence of diabetes no evidence of urinary tract infection well-appearing unclear why she is having more urine than normal, we will send a urine culture, close outpt f/u and return precautions given and understood.  Clinical Course    ____________________________________________   FINAL CLINICAL IMPRESSION(S) / ED DIAGNOSES  Final diagnoses:  None      This chart was dictated using voice recognition software.  Despite best efforts to proofread,  errors can occur which can change meaning.      Jeanmarie PlantJames A Alexus Galka, MD 06/25/16 [id number]    Jeanmarie PlantJames A Naven Giambalvo, MD 06/25/16 [phone]

## 2016-06-25 NOTE — ED Triage Notes (Signed)
Pt states she thinks she has a UTI d/t increase in urinary frequency x2 days. No pain with urination. No other discharge reported.

## 2016-09-29 ENCOUNTER — Encounter: Payer: Self-pay | Admitting: Emergency Medicine

## 2016-09-29 ENCOUNTER — Emergency Department
Admission: EM | Admit: 2016-09-29 | Discharge: 2016-09-29 | Disposition: A | Discharge status: Home / Self Care | Payer: Managed Care, Other (non HMO) | Attending: Emergency Medicine | Admitting: Emergency Medicine

## 2016-09-29 DIAGNOSIS — Z87891 Personal history of nicotine dependence: Secondary | ICD-10-CM | POA: Insufficient documentation

## 2016-09-29 DIAGNOSIS — B349 Viral infection, unspecified: Secondary | ICD-10-CM | POA: Insufficient documentation

## 2016-09-29 DIAGNOSIS — F418 Other specified anxiety disorders: Secondary | ICD-10-CM | POA: Insufficient documentation

## 2016-09-29 MED ORDER — ONDANSETRON HCL 8 MG PO TABS: 8.0000 mg | ORAL_TABLET | Freq: Three times a day (TID) | ORAL | 0 refills | Status: DC | PRN

## 2016-09-29 MED ORDER — PSEUDOEPH-BROMPHEN-DM 30-2-10 MG/5ML PO SYRP: 5.0000 mL | ORAL_SOLUTION | Freq: Four times a day (QID) | ORAL | 0 refills | Status: DC | PRN

## 2016-09-29 MED ORDER — BENZONATATE 100 MG PO CAPS
200.0000 mg | ORAL_CAPSULE | Freq: Once | ORAL | Status: AC
  Administered 2016-09-29: 200 mg via ORAL
  Filled 2016-09-29: qty 2

## 2016-09-29 MED ORDER — ONDANSETRON 8 MG PO TBDP
8.0000 mg | ORAL_TABLET | Freq: Once | ORAL | Status: AC
  Administered 2016-09-29: 8 mg via ORAL
  Filled 2016-09-29: qty 1

## 2016-09-29 NOTE — ED Triage Notes (Signed)
Patient presents to ED via POV with c/o emesis, chills, sweating, runny nose and coughing since Tuesday. Mask applied to patient. Patient denies CP, abdominal pain or SOB. A&O x4.

## 2016-09-29 NOTE — ED Provider Notes (Signed)
[facility] Emergency Department Provider Note   ____________________________________________   First MD Initiated Contact with Patient 09/29/16 1251     (approximate)  I have reviewed the triage vital signs and the nursing notes.   HISTORY  Chief Complaint Emesis    HPI Kimberly Arroyo is a 31 y.o. female patient complain of runny nose, coughing, chills, sweating, and vomiting for 3 days. Patient denies chest pain or abdominal pain. Patient denies diarrhea.No palliative measures for this complaint. Patient is not taking a flu shot this season. Patient rates her pain discomfort as a 5/10.   Past Medical History:  Diagnosis Date  . Depression     Patient Active Problem List   Diagnosis Date Noted  . Depression, major, single episode, moderate (HCC) 02/24/2016  . Generalized anxiety disorder 02/24/2016  . Involuntary commitment 02/24/2016    History reviewed. No pertinent surgical history.  Prior to Admission medications   Medication Sig Start Date End Date Taking? Authorizing Provider  amoxicillin-clavulanate (AUGMENTIN) 875-125 MG tablet Take 1 tablet by mouth 2 (two) times daily. 05/29/16   Delorise Royals Cuthriell, PA-C  brompheniramine-pseudoephedrine-DM 30-2-10 MG/5ML syrup Take 5 mLs by mouth 4 (four) times daily as needed. 09/29/16   Joni Reining, PA-C  butalbital-acetaminophen-caffeine (FIORICET) [phone] MG tablet Take 1-2 tablets by mouth every 6 (six) hours as needed for headache. 03/16/16 03/16/17  Emily Filbert, MD  ondansetron (ZOFRAN) 8 MG tablet Take 1 tablet (8 mg total) by mouth every 8 (eight) hours as needed for nausea or vomiting. 09/29/16   Joni Reining, PA-C    Allergies Patient has no known allergies.  No family history on file.  Social History Social History  Substance Use Topics  . Smoking status: Former Smoker    Packs/day: 0.00    Quit date: 09/25/2016  . Smokeless tobacco: Never Used  . Alcohol use Yes   Comment: occ    Review of Systems Constitutional: No fever/chills Eyes: No visual changes. ENT: No sore throat. Cardiovascular: Denies chest pain. Respiratory: Denies shortness of breath. Gastrointestinal: No abdominal pain.  No nausea, no vomiting.  No diarrhea.  No constipation. Genitourinary: Negative for dysuria. Musculoskeletal: Negative for back pain. Skin: Negative for rash. Neurological: Negative for headaches, focal weakness or numbness. Psychiatric:Anxiety and depression  ____________________________________________   PHYSICAL EXAM:  VITAL SIGNS: ED Triage Vitals [09/29/16 1226]  Enc Vitals Group     BP 121/72     Pulse Rate 85     Resp 18     Temp 99 F (37.2 C)     Temp Source Oral     SpO2 100 %     Weight 230 lb (104.3 kg)     Height 5\' 5"  (1.651 m)     Head Circumference      Peak Flow      Pain Score      Pain Loc      Pain Edu?      Excl. in GC?     Constitutional: Alert and oriented. Well appearing and in no acute distress. Eyes: Conjunctivae are normal. PERRL. EOMI. Head: Atraumatic. Nose: Edematous nasal turbinates clear rhinorrhea  Mouth/Throat: Mucous membranes are moist.  Oropharynx non-erythematous. Copious postnasal drainage Neck: No stridor.  No cervical spine tenderness to palpation. Hematological/Lymphatic/Immunilogical: No cervical lymphadenopathy. Cardiovascular: Normal rate, regular rhythm. Grossly normal heart sounds.  Good peripheral circulation. Respiratory: Normal respiratory effort.  No retractions. Lungs CTAB. Gastrointestinal: Soft and nontender. No distention. No abdominal bruits.  No CVA tenderness. Musculoskeletal: No lower extremity tenderness nor edema.  No joint effusions. Neurologic:  Normal speech and language. No gross focal neurologic deficits are appreciated. No gait instability. Skin:  Skin is warm, dry and intact. No rash noted. Psychiatric: Mood and affect are normal. Speech and behavior are  normal.  ____________________________________________   LABS (all labs ordered are listed, but only abnormal results are displayed)  Labs Reviewed - No data to display ____________________________________________  EKG   ____________________________________________  RADIOLOGY   ____________________________________________   PROCEDURES  Procedure(s) performed: None  Procedures  Critical Care performed: No  ____________________________________________   INITIAL IMPRESSION / ASSESSMENT AND PLAN / ED COURSE  Pertinent labs & imaging results that were available during my care of the patient were reviPatient given a work note. ewed by me and considered in my medical decision making (see chart for details).  Viral illness. Patient given discharge care instructions. Patient given a prescription for Zofran and Bromfed-DM. Patient given a work note. He advised to follow-up with [facility] if condition persists.    ____________________________________________   FINAL CLINICAL IMPRESSION(S) / ED DIAGNOSES  Final diagnoses:  Viral illness      NEW MEDICATIONS STARTED DURING THIS VISIT:  New Prescriptions   BROMPHENIRAMINE-PSEUDOEPHEDRINE-DM 30-2-10 MG/5ML SYRUP    Take 5 mLs by mouth 4 (four) times daily as needed.   ONDANSETRON (ZOFRAN) 8 MG TABLET    Take 1 tablet (8 mg total) by mouth every 8 (eight) hours as needed for nausea or vomiting.     Note:  This document was prepared using Dragon voice recognition software and may include unintentional dictation errors.    Joni ReiningRonald K Smith, PA-C 09/29/16 1300    Charlynne Panderavid Hsienta Yao, MD 09/29/16 1539

## 2017-01-21 ENCOUNTER — Emergency Department
Admission: EM | Admit: 2017-01-21 | Discharge: 2017-01-21 | Disposition: A | Discharge status: Home / Self Care | Payer: Managed Care, Other (non HMO) | Attending: Emergency Medicine | Admitting: Emergency Medicine

## 2017-01-21 ENCOUNTER — Encounter: Payer: Self-pay | Admitting: Emergency Medicine

## 2017-01-21 DIAGNOSIS — R350 Frequency of micturition: Secondary | ICD-10-CM | POA: Insufficient documentation

## 2017-01-21 DIAGNOSIS — Z87891 Personal history of nicotine dependence: Secondary | ICD-10-CM | POA: Insufficient documentation

## 2017-01-21 DIAGNOSIS — M545 Low back pain: Secondary | ICD-10-CM | POA: Insufficient documentation

## 2017-01-21 LAB — COMPREHENSIVE METABOLIC PANEL
ALK PHOS: 56 U/L (ref 38–126)
ALT: 15 U/L (ref 14–54)
AST: 18 U/L (ref 15–41)
Albumin: 3.9 g/dL (ref 3.5–5.0)
Anion gap: 7 (ref 5–15)
BUN: 13 mg/dL (ref 6–20)
CALCIUM: 9 mg/dL (ref 8.9–10.3)
CO2: 27 mmol/L (ref 22–32)
CREATININE: 0.73 mg/dL (ref 0.44–1.00)
Chloride: 105 mmol/L (ref 101–111)
GFR calc non Af Amer: >60 mL/min (ref >60)
Glucose, Bld: 107 mg/dL — ABNORMAL HIGH (ref 65–99)
Potassium: 3.7 mmol/L (ref 3.5–5.1)
SODIUM: 139 mmol/L (ref 135–145)
Total Bilirubin: 0.6 mg/dL (ref 0.3–1.2)
Total Protein: 7.7 g/dL (ref 6.5–8.1)

## 2017-01-21 LAB — POCT PREGNANCY, URINE: Preg Test, Ur: NEGATIVE

## 2017-01-21 LAB — URINALYSIS, COMPLETE (UACMP) WITH MICROSCOPIC
Bacteria, UA: NONE SEEN
Bilirubin Urine: NEGATIVE
GLUCOSE, UA: NEGATIVE mg/dL
HGB URINE DIPSTICK: NEGATIVE
Ketones, ur: NEGATIVE mg/dL
Leukocytes, UA: NEGATIVE
Nitrite: NEGATIVE
PH: 6 (ref 5.0–8.0)
Protein, ur: NEGATIVE mg/dL
SPECIFIC GRAVITY, URINE: 1.024 (ref 1.005–1.030)

## 2017-01-21 LAB — CBC
HCT: 41.8 % (ref 35.0–47.0)
Hemoglobin: 14.4 g/dL (ref 12.0–16.0)
MCH: 30 pg (ref 26.0–34.0)
MCHC: 34.3 g/dL (ref 32.0–36.0)
MCV: 87.5 fL (ref 80.0–100.0)
PLATELETS: 202 10*3/uL (ref 150–440)
RBC: 4.78 MIL/uL (ref 3.80–5.20)
RDW: 13.2 % (ref 11.5–14.5)
WBC: 9.9 10*3/uL (ref 3.6–11.0)

## 2017-01-21 LAB — LIPASE, BLOOD: Lipase: 27 U/L (ref 11–51)

## 2017-01-21 MED ORDER — NITROFURANTOIN MONOHYD MACRO 100 MG PO CAPS: 100.0000 mg | ORAL_CAPSULE | Freq: Two times a day (BID) | ORAL | 0 refills | Status: AC

## 2017-01-21 MED ORDER — PHENAZOPYRIDINE HCL 100 MG PO TABS: 100.0000 mg | ORAL_TABLET | Freq: Three times a day (TID) | ORAL | 0 refills | Status: AC | PRN

## 2017-01-21 NOTE — Discharge Instructions (Signed)
Please seek medical attention for any high fevers, chest pain, shortness of breath, change in behavior, persistent vomiting, bloody stool or any other new or concerning symptoms.  

## 2017-01-21 NOTE — ED Provider Notes (Signed)
[facility] Emergency Department Provider Note   ____________________________________________   I have reviewed the triage vital signs and the nursing notes.   HISTORY  Chief Complaint Urinary Frequency; Back Pain; and Emesis   History limited by: Not Limited   HPI Kimberly Arroyo is a 31 y.o. female who presents to the emergency department today because of concerns for urinary frequency back pain. She states symptoms started a couple of days ago. She has had frequent urination. She has also had some associated lower back pain. Patient states that at time of my examination the pain had resolved. She is also had some discomfort with urination although has not no 70 about odor to urine. States she has had urinary tract infections in the past and this somewhat reminds her of those previous episodes. She has had some associated nausea and vomiting. She denies any fevers.   Past Medical History:  Diagnosis Date  . Depression     Patient Active Problem List   Diagnosis Date Noted  . Depression, major, single episode, moderate (HCC) 02/24/2016  . Generalized anxiety disorder 02/24/2016  . Involuntary commitment 02/24/2016    History reviewed. No pertinent surgical history.  Prior to Admission medications   Medication Sig Start Date End Date Taking? Authorizing Provider  amoxicillin-clavulanate (AUGMENTIN) 875-125 MG tablet Take 1 tablet by mouth 2 (two) times daily. 05/29/16   Cuthriell, Delorise RoyalsJonathan D, PA-C  brompheniramine-pseudoephedrine-DM 30-2-10 MG/5ML syrup Take 5 mLs by mouth 4 (four) times daily as needed. 09/29/16   Joni ReiningSmith, Ronald K, PA-C  butalbital-acetaminophen-caffeine (FIORICET) [phone] MG tablet Take 1-2 tablets by mouth every 6 (six) hours as needed for headache. 03/16/16 03/16/17  Emily FilbertWilliams, Jonathan E, MD  nitrofurantoin, macrocrystal-monohydrate, (MACROBID) 100 MG capsule Take 1 capsule (100 mg total) by mouth 2 (two) times daily. 01/21/17 01/28/17   Phineas SemenGoodman, Athena Baltz, MD  ondansetron (ZOFRAN) 8 MG tablet Take 1 tablet (8 mg total) by mouth every 8 (eight) hours as needed for nausea or vomiting. 09/29/16   Joni ReiningSmith, Ronald K, PA-C  phenazopyridine (PYRIDIUM) 100 MG tablet Take 1 tablet (100 mg total) by mouth 3 (three) times daily as needed for pain. 01/21/17 01/21/18  Phineas SemenGoodman, Francy Mcilvaine, MD    Allergies Patient has no known allergies.  No family history on file.  Social History Social History  Substance Use Topics  . Smoking status: Former Smoker    Packs/day: 0.00    Quit date: 09/25/2016  . Smokeless tobacco: Never Used  . Alcohol use Yes     Comment: occ    Review of Systems Constitutional: No fever/chills Eyes: No visual changes. ENT: No sore throat. Cardiovascular: Denies chest pain. Respiratory: Denies shortness of breath. Gastrointestinal: Positive for nausea and vomiting.  Genitourinary: Positive for dysuria. Positive for urinary frequency.  Musculoskeletal: Positive for lower back pain. Skin: Negative for rash. Neurological: Negative for headaches, focal weakness or numbness.  ____________________________________________   PHYSICAL EXAM:  VITAL SIGNS: ED Triage Vitals [01/21/17 1059]  Enc Vitals Group     BP (!) 143/77     Pulse Rate 74     Resp 18     Temp 98.3 F (36.8 C)     Temp Source Oral     SpO2 99 %     Weight 238 lb (108 kg)     Height 5\' 5"  (1.651 m)     Head Circumference      Peak Flow      Pain Score 6   Constitutional: Alert and oriented.  Well appearing and in no distress. Eyes: Conjunctivae are normal.  ENT   Head: Normocephalic and atraumatic.   Nose: No congestion/rhinnorhea.   Mouth/Throat: Mucous membranes are moist.   Neck: No stridor. Hematological/Lymphatic/Immunilogical: No cervical lymphadenopathy. Cardiovascular: Normal rate, regular rhythm.  No murmurs, rubs, or gallops. Respiratory: Normal respiratory effort without tachypnea nor retractions. Breath sounds are  clear and equal bilaterally. No wheezes/rales/rhonchi. Gastrointestinal: Soft and minimally tender in the suprapubic region. Genitourinary: Deferred Musculoskeletal: Normal range of motion in all extremities. No lower extremity edema. Neurologic:  Normal speech and language. No gross focal neurologic deficits are appreciated.  Skin:  Skin is warm, dry and intact. No rash noted. Psychiatric: Mood and affect are normal. Speech and behavior are normal. Patient exhibits appropriate insight and judgment.  ____________________________________________    LABS (pertinent positives/negatives)  Labs Reviewed  COMPREHENSIVE METABOLIC PANEL - Abnormal; Notable for the following:       Result Value   Glucose, Bld 107 (*)    All other components within normal limits  URINALYSIS, COMPLETE (UACMP) WITH MICROSCOPIC - Abnormal; Notable for the following:    Color, Urine YELLOW (*)    APPearance CLEAR (*)    Squamous Epithelial / LPF 0-5 (*)    All other components within normal limits  CBC  LIPASE, BLOOD  POC URINE PREG, ED  POCT PREGNANCY, URINE     ____________________________________________   EKG  None  ____________________________________________    RADIOLOGY  None  ____________________________________________   PROCEDURES  Procedures  ____________________________________________   INITIAL IMPRESSION / ASSESSMENT AND PLAN / ED COURSE  Pertinent labs & imaging results that were available during my care of the patient were reviewed by me and considered in my medical decision making (see chart for details).  Patient presents with signs and symptoms consistent with urinary tract infection. Urine and blood work here however without any significant findings. Physical exam showed some minimal suprapubic pain. The clinical story will treat for presumed UTI. Discussed with patient importance of follow-up.  ____________________________________________   FINAL CLINICAL  IMPRESSION(S) / ED DIAGNOSES  Final diagnoses:  Urinary frequency     Note: This dictation was prepared with Dragon dictation. Any transcriptional errors that result from this process are unintentional     Phineas Semen, MD 01/21/17 1526

## 2017-01-21 NOTE — ED Notes (Signed)
Pt seems upset that she is dx with uti and given rx's for this. States her "discharge is already orange". Nonspecific at what she is angry with.

## 2017-01-21 NOTE — ED Triage Notes (Signed)
Patient presents to the ED with dysuria, frequency, lower back pain and orange vaginal discharge.  Patient also reports she has vomited x 3 today and is having intermittent pelvic/abdominal pain.  Patient is in no obvious distress at this time.  Using cell phone in triage while sitting in relaxed position.

## 2017-01-25 ENCOUNTER — Emergency Department
Admission: EM | Admit: 2017-01-25 | Discharge: 2017-01-26 | Disposition: A | Discharge status: Home / Self Care | Payer: Managed Care, Other (non HMO) | Attending: Emergency Medicine | Admitting: Emergency Medicine

## 2017-01-25 ENCOUNTER — Encounter: Payer: Self-pay | Admitting: *Deleted

## 2017-01-25 DIAGNOSIS — N760 Acute vaginitis: Secondary | ICD-10-CM | POA: Insufficient documentation

## 2017-01-25 DIAGNOSIS — Z79899 Other long term (current) drug therapy: Secondary | ICD-10-CM | POA: Insufficient documentation

## 2017-01-25 DIAGNOSIS — Z87891 Personal history of nicotine dependence: Secondary | ICD-10-CM | POA: Insufficient documentation

## 2017-01-25 DIAGNOSIS — B9689 Other specified bacterial agents as the cause of diseases classified elsewhere: Secondary | ICD-10-CM | POA: Insufficient documentation

## 2017-01-25 DIAGNOSIS — A64 Unspecified sexually transmitted disease: Secondary | ICD-10-CM | POA: Insufficient documentation

## 2017-01-25 NOTE — ED Notes (Signed)
Patient reports she was seen Sunday for vaginal itching and discharge. She was prescribed antibiotics for uti. Patient has two pills left.  Pt c/o pelvic cramps, dysuria, thick foul-smelling discharge and frequent urination.  Pt reports her boyfriend is being seen in the ED also for dysuria - burning with urination.

## 2017-01-25 NOTE — ED Triage Notes (Signed)
Pt has vaginal itching and discharge for 4 days.  Pt was seen in er recently for similar sx.  No vag bleeding.  Pt dx with uri and is taking abx.   Pt alert.

## 2017-01-25 NOTE — ED Provider Notes (Signed)
[facility] Emergency Department Provider Note    First MD Initiated Contact with Patient 01/25/17 2350     (approximate)  I have reviewed the triage vital signs and the nursing notes.   HISTORY  Chief Complaint Vaginal Itching and Vaginal Discharge   HPI Kimberly Arroyo is a 31 y.o. female resents emergency department with 4 day history of vaginal itching and foul-smelling discharge. Patient states that she's had 3 sexual partners in the last year each of which was unprotected sex. Patient denies any dysuria but does admit to urinary urgency. Of note patient's partner is currently being seen in the emergency department for penile discharge and dysuria. Patient denies any fever   Past Medical History:  Diagnosis Date  . Depression     Patient Active Problem List   Diagnosis Date Noted  . Depression, major, single episode, moderate (HCC) 02/24/2016  . Generalized anxiety disorder 02/24/2016  . Involuntary commitment 02/24/2016    No past surgical history on file.  Prior to Admission medications   Medication Sig Start Date End Date Taking? Authorizing Provider  amoxicillin-clavulanate (AUGMENTIN) 875-125 MG tablet Take 1 tablet by mouth 2 (two) times daily. 05/29/16   Cuthriell, Delorise RoyalsJonathan D, PA-C  brompheniramine-pseudoephedrine-DM 30-2-10 MG/5ML syrup Take 5 mLs by mouth 4 (four) times daily as needed. 09/29/16   Joni ReiningSmith, Ronald K, PA-C  butalbital-acetaminophen-caffeine (FIORICET) [phone] 656250-325-40 MG tablet Take 1-2 tablets by mouth every 6 (six) hours as needed for headache. 03/16/16 03/16/17  Emily FilbertWilliams, Jonathan E, MD  nitrofurantoin, macrocrystal-monohydrate, (MACROBID) 100 MG capsule Take 1 capsule (100 mg total) by mouth 2 (two) times daily. 01/21/17 01/28/17  Phineas SemenGoodman, Graydon, MD  ondansetron (ZOFRAN) 8 MG tablet Take 1 tablet (8 mg total) by mouth every 8 (eight) hours as needed for nausea or vomiting. 09/29/16   Joni ReiningSmith, Ronald K, PA-C  phenazopyridine (PYRIDIUM) 100  MG tablet Take 1 tablet (100 mg total) by mouth 3 (three) times daily as needed for pain. 01/21/17 01/21/18  Phineas SemenGoodman, Graydon, MD    Allergies No known drug allergies No family history on file.  Social History Social History  Substance Use Topics  . Smoking status: Former Smoker    Packs/day: 0.00    Quit date: 09/25/2016  . Smokeless tobacco: Never Used  . Alcohol use Yes     Comment: occ    Review of Systems Constitutional: No fever/chills Eyes: No visual changes. ENT: No sore throat. Cardiovascular: Denies chest pain. Respiratory: Denies shortness of breath. Gastrointestinal: No abdominal pain.  No nausea, no vomiting.  No diarrhea.  No constipation. Genitourinary: As a for vaginal discharge and vaginal itching Musculoskeletal: Negative for neck pain.  Negative for back pain. Integumentary: Negative for rash. Neurological: Negative for headaches, focal weakness or numbness.   ____________________________________________   PHYSICAL EXAM:  VITAL SIGNS: ED Triage Vitals  Enc Vitals Group     BP 01/25/17 2158 (!) 143/55     Pulse Rate 01/25/17 2158 77     Resp 01/25/17 2158 18     Temp 01/25/17 2158 98.4 F (36.9 C)     Temp Source 01/25/17 2158 Oral     SpO2 01/25/17 2158 99 %     Weight 01/25/17 2155 108 kg (238 lb)     Height 01/25/17 2155 1.651 m (5\' 5" )     Head Circumference --      Peak Flow --      Pain Score 01/25/17 2323 0     Pain Loc --  Pain Edu? --      Excl. in GC? --     Constitutional: Alert and oriented. Well appearing and in no acute distress. Eyes: Conjunctivae are normal.  Head: Atraumatic.Marland Kitchen Mouth/Throat: Mucous membranes are moist. Oropharynx non-erythematous. Neck: No stridor.   Cardiovascular: Normal rate, regular rhythm. Good peripheral circulation. Grossly normal heart sounds. Respiratory: Normal respiratory effort.  No retractions. Lungs CTAB. Gastrointestinal: Soft and nontender. No distention.  Genitourinary: Malodorous  yellow vaginal discharge. Musculoskeletal: No lower extremity tenderness nor edema. No gross deformities of extremities. Neurologic:  Normal speech and language. No gross focal neurologic deficits are appreciated.  Skin:  Skin is warm, dry and intact. No rash noted. Psychiatric: Mood and affect are normal. Speech and behavior are normal.  ____________________________________________   LABS (all labs ordered are listed, but only abnormal results are displayed)  Labs Reviewed  CHLAMYDIA/NGC RT PCR ([facility] ONLY)  WET PREP, GENITAL  RPR  HIV ANTIBODY (ROUTINE TESTING)   ____________________________________________      Procedures   ____________________________________________   INITIAL IMPRESSION / ASSESSMENT AND PLAN / ED COURSE  Pertinent labs & imaging results that were available during my care of the patient were reviewed by me and considered in my medical decision making (see chart for details).  31 year old female presenting with malodorous yellow vaginal discharge. Multiple sexual partners is here unprotected. Of note patient's current sexual partner currently has dysuria.    ____________________________________________  FINAL CLINICAL IMPRESSION(S) / ED DIAGNOSES  Final diagnoses:  BV (bacterial vaginosis)  STD (female)     MEDICATIONS GIVEN DURING THIS VISIT:  Medications - No data to display   NEW OUTPATIENT MEDICATIONS STARTED DURING THIS VISIT:  New Prescriptions   No medications on file    Modified Medications   No medications on file    Discontinued Medications   No medications on file     Note:  This document was prepared using Dragon voice recognition software and may include unintentional dictation errors.     Darci Current, MD 01/26/17 [phone]

## 2017-01-26 LAB — CHLAMYDIA/NGC RT PCR (ARMC ONLY)
Chlamydia Tr: NOT DETECTED
N gonorrhoeae: NOT DETECTED

## 2017-01-26 LAB — WET PREP, GENITAL
SPERM: NONE SEEN
Trich, Wet Prep: NONE SEEN
Yeast Wet Prep HPF POC: NONE SEEN

## 2017-01-26 MED ORDER — CEFTRIAXONE SODIUM 250 MG IJ SOLR
250.0000 mg | Freq: Once | INTRAMUSCULAR | Status: AC
  Administered 2017-01-26: 250 mg via INTRAMUSCULAR
  Filled 2017-01-26: qty 250

## 2017-01-26 MED ORDER — AZITHROMYCIN 1 G PO PACK
1.0000 g | PACK | Freq: Once | ORAL | Status: AC
  Administered 2017-01-26: 1 g via ORAL
  Filled 2017-01-26: qty 1

## 2017-01-26 MED ORDER — METRONIDAZOLE 500 MG PO TABS: 500.0000 mg | ORAL_TABLET | Freq: Two times a day (BID) | ORAL | 0 refills | Status: AC

## 2017-01-26 NOTE — ED Notes (Signed)

## 2017-01-27 LAB — RPR: RPR: NONREACTIVE

## 2017-01-27 LAB — HIV ANTIBODY (ROUTINE TESTING W REFLEX): HIV SCREEN 4TH GENERATION: NONREACTIVE

## 2017-01-29 ENCOUNTER — Telehealth: Payer: Self-pay | Admitting: Emergency Medicine

## 2017-01-29 NOTE — Telephone Encounter (Signed)
Patient called for her lab results.  She says she is feeling better.  I gave her results for gc/chlamydia/hiv/rpr and explained results to patient.

## 2017-03-26 ENCOUNTER — Encounter: Payer: Self-pay | Admitting: Emergency Medicine

## 2017-03-26 ENCOUNTER — Emergency Department
Admission: EM | Admit: 2017-03-26 | Discharge: 2017-03-26 | Disposition: A | Discharge status: Home / Self Care | Payer: Self-pay | Attending: Emergency Medicine | Admitting: Emergency Medicine

## 2017-03-26 DIAGNOSIS — R21 Rash and other nonspecific skin eruption: Secondary | ICD-10-CM

## 2017-03-26 DIAGNOSIS — Z87891 Personal history of nicotine dependence: Secondary | ICD-10-CM | POA: Insufficient documentation

## 2017-03-26 MED ORDER — HYDROCORTISONE VALERATE 0.2 % EX OINT: TOPICAL_OINTMENT | CUTANEOUS | 1 refills | Status: AC

## 2017-03-26 MED ORDER — NAPROXEN 500 MG PO TABS: 500.0000 mg | ORAL_TABLET | Freq: Two times a day (BID) | ORAL | Status: DC

## 2017-03-26 NOTE — ED Triage Notes (Signed)
Pt reports possible insect bite/rash to left thigh, started today. Reports it's "throbbing".

## 2017-03-26 NOTE — ED Notes (Signed)
See triage note  States she noticed a large red area to left upper thigh  States area is very tender and is throbbing  Unsure if she was stung by something or not

## 2017-03-26 NOTE — ED Provider Notes (Signed)
[facility] Emergency Department Provider Note   ____________________________________________   First MD Initiated Contact with Patient 03/26/17 1616     (approximate)  I have reviewed the triage vital signs and the nursing notes.   HISTORY  Chief Complaint Insect Bite    HPI Kimberly Arroyo is a 31 y.o. female patient complaining of a rash to the lateral upper left thigh. Patient state rash started today.Patient states she was outside doing yard work yesterday. When questioned the patient cannot remember that she have more short or long pains. Patient rates pain as a 3/10. She describes the pain as "throbbing". Patient denies itching or burning sensation from the rash. No palliative measures for complaint.   Past Medical History:  Diagnosis Date  . Depression     Patient Active Problem List   Diagnosis Date Noted  . Depression, major, single episode, moderate (HCC) 02/24/2016  . Generalized anxiety disorder 02/24/2016  . Involuntary commitment 02/24/2016    No past surgical history on file.  Prior to Admission medications   Medication Sig Start Date End Date Taking? Authorizing Provider  amoxicillin-clavulanate (AUGMENTIN) 875-125 MG tablet Take 1 tablet by mouth 2 (two) times daily. 05/29/16   Cuthriell, Delorise RoyalsJonathan D, PA-C  brompheniramine-pseudoephedrine-DM 30-2-10 MG/5ML syrup Take 5 mLs by mouth 4 (four) times daily as needed. 09/29/16   Joni ReiningSmith, Revere Maahs K, PA-C  hydrocortisone valerate ointment (WESTCORT) 0.2 % Apply to affected area daily 03/26/17 03/26/18  Joni ReiningSmith, Pistol Kessenich K, PA-C  naproxen (NAPROSYN) 500 MG tablet Take 1 tablet (500 mg total) by mouth 2 (two) times daily with a meal. 03/26/17   Joni ReiningSmith, Tawonna Esquer K, PA-C  ondansetron (ZOFRAN) 8 MG tablet Take 1 tablet (8 mg total) by mouth every 8 (eight) hours as needed for nausea or vomiting. 09/29/16   Joni ReiningSmith, Rowan Blaker K, PA-C  phenazopyridine (PYRIDIUM) 100 MG tablet Take 1 tablet (100 mg total) by mouth 3  (three) times daily as needed for pain. 01/21/17 01/21/18  Phineas SemenGoodman, Graydon, MD    Allergies Patient has no known allergies.  No family history on file.  Social History Social History  Substance Use Topics  . Smoking status: Former Smoker    Packs/day: 0.00    Quit date: 09/25/2016  . Smokeless tobacco: Never Used  . Alcohol use Yes     Comment: occ    Review of Systems  Constitutional: No fever/chills Eyes: No visual changes. ENT: No sore throat. Cardiovascular: Denies chest pain. Respiratory: Denies shortness of breath. Gastrointestinal: No abdominal pain.  No nausea, no vomiting.  No diarrhea.  No constipation. Genitourinary: Negative for dysuria. Musculoskeletal: Negative for back pain. Skin: Positive for rash. Neurological: Negative for headaches, focal weakness or numbness. Psychiatric:Anxiety depression ____________________________________________   PHYSICAL EXAM:  VITAL SIGNS: ED Triage Vitals  Enc Vitals Group     BP 03/26/17 1513 (!) 162/65     Pulse Rate 03/26/17 1513 71     Resp 03/26/17 1513 15     Temp 03/26/17 1513 98.6 F (37 C)     Temp Source 03/26/17 1513 Oral     SpO2 03/26/17 1513 99 %     Weight 03/26/17 1514 238 lb (108 kg)     Height 03/26/17 1514 5\' 5"  (1.651 m)     Head Circumference --      Peak Flow --      Pain Score 03/26/17 1513 3     Pain Loc --      Pain Edu? --  Excl. in GC? --     Constitutional: Alert and oriented. Well appearing and in no acute distress. Cardiovascular: Normal rate, regular rhythm. Grossly normal heart sounds.  Good peripheral circulation. Respiratory: Normal respiratory effort.  No retractions. Lungs CTAB. Gastrointestinal: Soft and nontender. No distention. No abdominal bruits. No CVA tenderness. Musculoskeletal: No lower extremity tenderness nor edema.  No joint effusions. Neurologic:  Normal speech and language. No gross focal neurologic deficits are appreciated. No gait instability. Skin:  Skin  is warm, dry and intact. Macular lesion left lateral upper thigh.  Psychiatric: Mood and affect are normal. Speech and behavior are normal.  ____________________________________________   LABS (all labs ordered are listed, but only abnormal results are displayed)  Labs Reviewed - No data to display ____________________________________________  EKG   ____________________________________________  RADIOLOGY  No results found.  ____________________________________________   PROCEDURES  Procedure(s) performed: None  Procedures  Critical Care performed: No  ____________________________________________   INITIAL IMPRESSION / ASSESSMENT AND PLAN / ED COURSE  Pertinent labs & imaging results that were available during my care of the patient were reviewed by me and considered in my medical decision making (see chart for details).  Contact dermatitis. Patient given discharge care instructions. Patient advised follow-up with PCP if no improvement 3-5 days.      ____________________________________________   FINAL CLINICAL IMPRESSION(S) / ED DIAGNOSES  Final diagnoses:  Rash      NEW MEDICATIONS STARTED DURING THIS VISIT:  New Prescriptions   HYDROCORTISONE VALERATE OINTMENT (WESTCORT) 0.2 %    Apply to affected area daily   NAPROXEN (NAPROSYN) 500 MG TABLET    Take 1 tablet (500 mg total) by mouth 2 (two) times daily with a meal.     Note:  This document was prepared using Dragon voice recognition software and may include unintentional dictation errors.    Joni Reining, PA-C 03/26/17 1630    Don Perking, [place], MD 03/27/17 [phone]
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
## 2017-11-23 ENCOUNTER — Other Ambulatory Visit: Payer: Self-pay

## 2017-11-23 ENCOUNTER — Emergency Department
Admission: EM | Admit: 2017-11-23 | Discharge: 2017-11-23 | Disposition: A | Discharge status: Home / Self Care | Payer: Self-pay | Attending: Emergency Medicine | Admitting: Emergency Medicine

## 2017-11-23 ENCOUNTER — Encounter: Payer: Self-pay | Admitting: *Deleted

## 2017-11-23 DIAGNOSIS — Z87891 Personal history of nicotine dependence: Secondary | ICD-10-CM | POA: Insufficient documentation

## 2017-11-23 DIAGNOSIS — F411 Generalized anxiety disorder: Secondary | ICD-10-CM | POA: Insufficient documentation

## 2017-11-23 DIAGNOSIS — F329 Major depressive disorder, single episode, unspecified: Secondary | ICD-10-CM | POA: Insufficient documentation

## 2017-11-23 DIAGNOSIS — T7411XA Adult physical abuse, confirmed, initial encounter: Secondary | ICD-10-CM | POA: Insufficient documentation

## 2017-11-23 LAB — COMPREHENSIVE METABOLIC PANEL
ALBUMIN: 3.8 g/dL (ref 3.5–5.0)
ALT: 16 U/L (ref 14–54)
AST: 17 U/L (ref 15–41)
Alkaline Phosphatase: 57 U/L (ref 38–126)
Anion gap: 5 (ref 5–15)
BUN: 11 mg/dL (ref 6–20)
CHLORIDE: 107 mmol/L (ref 101–111)
CO2: 25 mmol/L (ref 22–32)
Calcium: 8.8 mg/dL — ABNORMAL LOW (ref 8.9–10.3)
Creatinine, Ser: 0.66 mg/dL (ref 0.44–1.00)
GFR calc Af Amer: >60 mL/min (ref >60)
GLUCOSE: 99 mg/dL (ref 65–99)
POTASSIUM: 3.7 mmol/L (ref 3.5–5.1)
Sodium: 137 mmol/L (ref 135–145)
Total Bilirubin: 0.8 mg/dL (ref 0.3–1.2)
Total Protein: 8.2 g/dL — ABNORMAL HIGH (ref 6.5–8.1)

## 2017-11-23 LAB — CBC
HEMATOCRIT: 40 % (ref 35.0–47.0)
Hemoglobin: 13.7 g/dL (ref 12.0–16.0)
MCH: 30.1 pg (ref 26.0–34.0)
MCHC: 34.2 g/dL (ref 32.0–36.0)
MCV: 87.9 fL (ref 80.0–100.0)
PLATELETS: 219 10*3/uL (ref 150–440)
RBC: 4.55 MIL/uL (ref 3.80–5.20)
RDW: 13.3 % (ref 11.5–14.5)
WBC: 13.9 10*3/uL — AB (ref 3.6–11.0)

## 2017-11-23 LAB — RAPID HIV SCREEN (HIV 1/2 AB+AG)
HIV 1/2 ANTIBODIES: NONREACTIVE
HIV-1 P24 ANTIGEN - HIV24: NONREACTIVE
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
## 2017-11-23 NOTE — ED Notes (Signed)
Dr. Manson PasseyBrown to family consult room where pt is waiting to be placed in exam room for evaluation. Pt has decided to wait for labs to be drawn.
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
## 2017-11-23 NOTE — ED Notes (Signed)
Pt to nurses station from family consult room. States she has to go to work in a couple of hours and is not wanting to wait to be taken to exam room. Pt states she is wanting to leave. Charge nurse and Dr. Manson PasseyBrown aware.

## 2017-11-24 LAB — HEPATITIS PANEL, ACUTE
HCV Ab: <0.1 s/co ratio (ref 0.0–0.9)
HEP B C IGM: NEGATIVE
HEP B S AG: NEGATIVE
Hep A IgM: NEGATIVE

## 2017-12-31 ENCOUNTER — Emergency Department
Admission: EM | Admit: 2017-12-31 | Discharge: 2017-12-31 | Disposition: A | Discharge status: Home / Self Care | Payer: Self-pay | Attending: Emergency Medicine | Admitting: Emergency Medicine

## 2017-12-31 ENCOUNTER — Other Ambulatory Visit: Payer: Self-pay

## 2017-12-31 DIAGNOSIS — Z79899 Other long term (current) drug therapy: Secondary | ICD-10-CM | POA: Insufficient documentation

## 2017-12-31 DIAGNOSIS — K029 Dental caries, unspecified: Secondary | ICD-10-CM | POA: Insufficient documentation

## 2017-12-31 DIAGNOSIS — Z87891 Personal history of nicotine dependence: Secondary | ICD-10-CM | POA: Insufficient documentation

## 2017-12-31 DIAGNOSIS — K047 Periapical abscess without sinus: Secondary | ICD-10-CM | POA: Insufficient documentation

## 2017-12-31 MED ORDER — LIDOCAINE VISCOUS HCL 2 % MT SOLN
15.0000 mL | Freq: Once | OROMUCOSAL | Status: AC
  Administered 2017-12-31: 15 mL via OROMUCOSAL
  Filled 2017-12-31: qty 15

## 2017-12-31 MED ORDER — AMOXICILLIN-POT CLAVULANATE 875-125 MG PO TABS: 1.0000 | ORAL_TABLET | Freq: Two times a day (BID) | ORAL | 0 refills | Status: AC

## 2017-12-31 MED ORDER — KETOROLAC TROMETHAMINE 10 MG PO TABS: 10.0000 mg | ORAL_TABLET | Freq: Four times a day (QID) | ORAL | 0 refills | Status: DC | PRN

## 2017-12-31 MED ORDER — AMOXICILLIN-POT CLAVULANATE 875-125 MG PO TABS
1.0000 | ORAL_TABLET | Freq: Once | ORAL | Status: AC
  Administered 2017-12-31: 1 via ORAL
  Filled 2017-12-31: qty 1

## 2017-12-31 MED ORDER — KETOROLAC TROMETHAMINE 10 MG PO TABS
10.0000 mg | ORAL_TABLET | Freq: Once | ORAL | Status: AC
  Administered 2017-12-31: 10 mg via ORAL
  Filled 2017-12-31: qty 1

## 2017-12-31 MED ORDER — FLUCONAZOLE 100 MG PO TABS
150.0000 mg | ORAL_TABLET | Freq: Once | ORAL | Status: AC
  Administered 2017-12-31: 150 mg via ORAL
  Filled 2017-12-31: qty 1

## 2017-12-31 NOTE — ED Provider Notes (Signed)
[facility] Emergency Department Provider Note    First MD Initiated Contact with Patient 12/31/17 [phone]     (approximate)  I have reviewed the triage vital signs and the nursing notes.   HISTORY  Chief Complaint Dental Pain (Two abscessed teeth)    HPI Kimberly Arroyo is a 32 y.o. female presents to the emergency department with a 3-day history of right maxillary molar and premolar pain and dental caries.  Patient states current pain score is 8 out of 10.  Patient denies any fever   Past Medical History:  Diagnosis Date  . Depression     Patient Active Problem List   Diagnosis Date Noted  . Depression, major, single episode, moderate (HCC) 02/24/2016  . Generalized anxiety disorder 02/24/2016  . Involuntary commitment 02/24/2016    Past surgical history None   Prior to Admission medications   Medication Sig Start Date End Date Taking? Authorizing Provider  amoxicillin-clavulanate (AUGMENTIN) 875-125 MG tablet Take 1 tablet by mouth 2 (two) times daily. 05/29/16   Cuthriell, Delorise Royals, PA-C  brompheniramine-pseudoephedrine-DM 30-2-10 MG/5ML syrup Take 5 mLs by mouth 4 (four) times daily as needed. 09/29/16   Joni Reining, PA-C  hydrocortisone valerate ointment (WESTCORT) 0.2 % Apply to affected area daily 03/26/17 03/26/18  Joni Reining, PA-C  naproxen (NAPROSYN) 500 MG tablet Take 1 tablet (500 mg total) by mouth 2 (two) times daily with a meal. 03/26/17   Joni Reining, PA-C  ondansetron (ZOFRAN) 8 MG tablet Take 1 tablet (8 mg total) by mouth every 8 (eight) hours as needed for nausea or vomiting. 09/29/16   Joni Reining, PA-C  phenazopyridine (PYRIDIUM) 100 MG tablet Take 1 tablet (100 mg total) by mouth 3 (three) times daily as needed for pain. 01/21/17 01/21/18  Phineas Semen, MD    Allergies No known drug allergies No family history on file.  Social History Social History   Tobacco Use  . Smoking status: Former Smoker   Packs/day: 0.00    Last attempt to quit: 09/25/2016    Years since quitting: 1.2  . Smokeless tobacco: Never Used  Substance Use Topics  . Alcohol use: Yes    Comment: occ  . Drug use: No    Review of Systems Constitutional: No fever/chills Eyes: No visual changes. ENT: No sore throat. Cardiovascular: Denies chest pain. Respiratory: Denies shortness of breath. Gastrointestinal: No abdominal pain.  No nausea, no vomiting.  No diarrhea.  No constipation. Genitourinary: Negative for dysuria. Musculoskeletal: Negative for neck pain.  Negative for back pain. Integumentary: Negative for rash. Neurological: Negative for headaches, focal weakness or numbness.   ____________________________________________   PHYSICAL EXAM:  VITAL SIGNS: ED Triage Vitals  Enc Vitals Group     BP 12/31/17 0030 (!) 139/97     Pulse Rate 12/31/17 0030 65     Resp 12/31/17 0030 18     Temp 12/31/17 0030 98.2 F (36.8 C)     Temp Source 12/31/17 0030 Oral     SpO2 12/31/17 0030 100 %     Weight 12/31/17 0034 108.9 kg (240 lb)     Height 12/31/17 0034 1.651 m ( )     Head Circumference --      Peak Flow --      Pain Score 12/31/17 0034 10     Pain Loc --      Pain Edu? --      Excl. in GC? --     Constitutional:  Alert and oriented. Well appearing and in no acute distress. Eyes: Conjunctivae are normal.  Head: Atraumatic. Mouth/Throat: Mucous membranes are moist.  2 maxillary molar dental cavities with surrounding gum erythema noted oropharynx non-erythematous. Neck: No stridor.   Cardiovascular: Normal rate, regular rhythm. Good peripheral circulation. Grossly normal heart sounds. Skin:  Skin is warm, dry and intact. No rash noted. Psychiatric: Mood and affect are normal. Speech and behavior are normal.     Procedures   ____________________________________________   INITIAL IMPRESSION / ASSESSMENT AND PLAN / ED COURSE  As part of my medical decision making, I reviewed the  following data within the electronic MEDICAL RECORD NUMBER32 year old female presented with above-stated history and physical exam consistent with dental caries concern for possible total abscess.  Patient given Augmentin in the emergency department will be prescribed same for home.  Patient also given Toradol for pain.  Patient advised of warning signs that would warrant immediate return to the emergency department. ____________________________________________  FINAL CLINICAL IMPRESSION(S) / ED DIAGNOSES  Final diagnoses:  Dental caries  Dental abscess     MEDICATIONS GIVEN DURING THIS VISIT:  Medications  lidocaine (XYLOCAINE) 2 % viscous mouth solution 15 mL (has no administration in time range)  amoxicillin-clavulanate (AUGMENTIN) 875-125 MG per tablet 1 tablet (has no administration in time range)  ketorolac (TORADOL) tablet 10 mg (has no administration in time range)     ED Discharge Orders    None       Note:  This document was prepared using Dragon voice recognition software and may include unintentional dictation errors.    Darci Current, MD 12/31/17 [phone]

## 2017-12-31 NOTE — ED Triage Notes (Signed)
Patient with two abscessed teeth, she believes, on the right upper side. Patient "trying to make an appointment this week" with her dentist.

## 2017-12-31 NOTE — ED Notes (Signed)
Pt reports pain in right upper (front and back) teeth, hx of poor dentition and caries, reports pain started suddenly last tonight,   Speech deficit noted, pt is sleepy, no swelling to face or threat to airway noted, mentioned teeth appear dark and gums swollen

## 2018-09-27 ENCOUNTER — Other Ambulatory Visit: Payer: Self-pay

## 2018-09-27 ENCOUNTER — Emergency Department: Payer: BLUE CROSS/BLUE SHIELD

## 2018-09-27 ENCOUNTER — Emergency Department
Admission: EM | Admit: 2018-09-27 | Discharge: 2018-09-27 | Disposition: A | Discharge status: Home / Self Care | Payer: BLUE CROSS/BLUE SHIELD | Attending: Emergency Medicine | Admitting: Emergency Medicine

## 2018-09-27 DIAGNOSIS — F411 Generalized anxiety disorder: Secondary | ICD-10-CM | POA: Insufficient documentation

## 2018-09-27 DIAGNOSIS — Z87891 Personal history of nicotine dependence: Secondary | ICD-10-CM | POA: Diagnosis not present

## 2018-09-27 DIAGNOSIS — M25531 Pain in right wrist: Secondary | ICD-10-CM | POA: Diagnosis present

## 2018-09-27 DIAGNOSIS — M65841 Other synovitis and tenosynovitis, right hand: Secondary | ICD-10-CM | POA: Diagnosis not present

## 2018-09-27 DIAGNOSIS — F329 Major depressive disorder, single episode, unspecified: Secondary | ICD-10-CM | POA: Insufficient documentation

## 2018-09-27 DIAGNOSIS — M659 Synovitis and tenosynovitis, unspecified: Secondary | ICD-10-CM

## 2018-09-27 MED ORDER — NAPROXEN 500 MG PO TABS: 500.0000 mg | ORAL_TABLET | Freq: Two times a day (BID) | ORAL | Status: DC

## 2018-09-27 MED ORDER — NAPROXEN 500 MG PO TABS
500.0000 mg | ORAL_TABLET | Freq: Once | ORAL | Status: AC
  Administered 2018-09-27: 500 mg via ORAL
  Filled 2018-09-27: qty 1

## 2018-09-27 NOTE — ED Triage Notes (Signed)
Pt reports that she has been doing a new job at work and since then she has been having pain in her wrist that goes up to her elbow.

## 2018-09-27 NOTE — Discharge Instructions (Addendum)
Wear wrist splint for 7 to 10 days while working.

## 2018-09-27 NOTE — ED Notes (Signed)
See triage note  Presents with pain to right hand  States she uses that hand a lot while working  Pain started about 1 week ago  No deformity noted  Good pulses  States het helps the pain

## 2018-09-27 NOTE — ED Provider Notes (Signed)
[facility] Emergency Department Provider Note   ____________________________________________   First MD Initiated Contact with Patient 09/27/18 [phone]     (approximate)  I have reviewed the triage vital signs and the nursing notes.   HISTORY  Chief Complaint Hand Pain    HPI Kimberly Arroyo is a 33 y.o. female patient complain of 4 days of increasing wrist pain.  Patient did pain increased with flexion of the wrist.  Patient reports history of repetitive motion using a pressor at work.  This is a new position at her job.  Patient denies loss of sensation or strength.  Patient rates the pain as a 4/10.  Patient described the pain as "achy".  No palliative measure for complaint.   Past Medical History:  Diagnosis Date  . Depression     Patient Active Problem List   Diagnosis Date Noted  . Depression, major, single episode, moderate (HCC) 02/24/2016  . Generalized anxiety disorder 02/24/2016  . Involuntary commitment 02/24/2016    History reviewed. No pertinent surgical history.  Prior to Admission medications   Medication Sig Start Date End Date Taking? Authorizing Provider  naproxen (NAPROSYN) 500 MG tablet Take 1 tablet (500 mg total) by mouth 2 (two) times daily with a meal. 09/27/18   Joni Reining, PA-C    Allergies Patient has no known allergies.  No family history on file.  Social History Social History   Tobacco Use  . Smoking status: Former Smoker    Packs/day: 0.00    Last attempt to quit: 09/25/2016    Years since quitting: 2.0  . Smokeless tobacco: Never Used  Substance Use Topics  . Alcohol use: Yes    Comment: occ  . Drug use: No    Review of Systems Constitutional: No fever/chills Eyes: No visual changes. ENT: No sore throat. Cardiovascular: Denies chest pain. Respiratory: Denies shortness of breath. Gastrointestinal: No abdominal pain.  No nausea, no vomiting.  No diarrhea.  No constipation. Genitourinary:  Negative for dysuria. Musculoskeletal: Negative for back pain. Skin: Negative for rash. Neurological: Negative for headaches, focal weakness or numbness. Psychiatric:  Depression   ____________________________________________   PHYSICAL EXAM:  VITAL SIGNS: ED Triage Vitals [09/27/18 0842]  Enc Vitals Group     BP 138/77     Pulse Rate 66     Resp 16     Temp 98.2 F (36.8 C)     Temp Source Oral     SpO2 99 %     Weight 250 lb (113.4 kg)     Height 5\' 5"  (1.651 m)     Head Circumference      Peak Flow      Pain Score 4     Pain Loc      Pain Edu?      Excl. in GC?     Constitutional: Alert and oriented. Well appearing and in no acute distress. Cardiovascular: Normal rate, regular rhythm. Grossly normal heart sounds.  Good peripheral circulation. Respiratory: Normal respiratory effort.  No retractions. Lungs CTAB. Gastrointestinal: Soft and nontender. No distention. No abdominal bruits. No CVA tenderness. Musculoskeletal: No obvious deformity to the right wrist.  Patient has full equal range of motion with complaint of pain with full flexion.   Neurologic:  Normal speech and language. No gross focal neurologic deficits are appreciated. No gait instability. Skin:  Skin is warm, dry and intact. No rash noted. Psychiatric: Mood and affect are normal. Speech and behavior are normal.  ____________________________________________  LABS (all labs ordered are listed, but only abnormal results are displayed)  Labs Reviewed - No data to display ____________________________________________  EKG   ____________________________________________  RADIOLOGY  ED MD interpretation:    Official radiology report(s): Dg Wrist Complete Right  Result Date: 09/27/2018 CLINICAL DATA:  Four days of wrist pain increased with flexion EXAM: RIGHT WRIST - COMPLETE 3+ VIEW COMPARISON:  None FINDINGS: Osseous mineralization normal. Ulnar minus variance. Joint spaces otherwise preserved.  No acute fracture, dislocation, or bone destruction. No erosive or inflammatory changes. IMPRESSION: No acute osseous abnormalities. Ulnar minus variance. Electronically Signed   By: Ulyses Southward M.D.   On: 09/27/2018 10:18    ____________________________________________   PROCEDURES  Procedure(s) performed: None  .Splint Application Date/Time: 09/27/2018 10:25 AM Performed by: Marguerita Merles, NT Authorized by: Joni Reining, PA-C   Consent:    Consent obtained:  Verbal   Consent given by:  Patient   Risks discussed:  Numbness, pain and swelling Pre-procedure details:    Sensation:  Normal Procedure details:    Laterality:  Right   Location:  Wrist   Wrist:  R wrist   Splint type:  Wrist   Supplies:  Prefabricated splint Post-procedure details:    Pain:  Unchanged   Sensation:  Normal   Patient tolerance of procedure:  Tolerated well, no immediate complications    Critical Care performed: No  ____________________________________________   INITIAL IMPRESSION / ASSESSMENT AND PLAN / ED COURSE  As part of my medical decision making, I reviewed the following data within the electronic MEDICAL RECORD NUMBER     Wrist pain secondary tenosynovitis.  Patient placed in a wrist splint and given discharge care instruction.  Patient advised follow-up with PCP if condition persist.     ____________________________________________   FINAL CLINICAL IMPRESSION(S) / ED DIAGNOSES  Final diagnoses:  Tenosynovitis of right wrist     ED Discharge Orders         Ordered    naproxen (NAPROSYN) 500 MG tablet  2 times daily with meals     09/27/18 1022           Note:  This document was prepared using Dragon voice recognition software and may include unintentional dictation errors.    Joni Reining, PA-C 09/27/18 1026    Schaevitz, Myra Rude, MD 09/27/18 [phone]

## 2018-09-27 NOTE — ED Triage Notes (Signed)
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

## 2019-01-29 ENCOUNTER — Encounter: Payer: Self-pay | Admitting: Emergency Medicine

## 2019-01-29 ENCOUNTER — Other Ambulatory Visit: Payer: Self-pay

## 2019-01-29 DIAGNOSIS — R350 Frequency of micturition: Secondary | ICD-10-CM | POA: Insufficient documentation

## 2019-01-29 DIAGNOSIS — Z5321 Procedure and treatment not carried out due to patient leaving prior to being seen by health care provider: Secondary | ICD-10-CM | POA: Diagnosis not present

## 2019-01-29 LAB — POCT PREGNANCY, URINE: Preg Test, Ur: NEGATIVE

## 2019-01-29 LAB — URINALYSIS, COMPLETE (UACMP) WITH MICROSCOPIC
Bacteria, UA: NONE SEEN
Bilirubin Urine: NEGATIVE
Glucose, UA: NEGATIVE mg/dL
Hgb urine dipstick: NEGATIVE
Ketones, ur: NEGATIVE mg/dL
Leukocytes,Ua: NEGATIVE
Nitrite: NEGATIVE
Protein, ur: NEGATIVE mg/dL
Specific Gravity, Urine: 1.015 (ref 1.005–1.030)
WBC, UA: NONE SEEN WBC/hpf (ref 0–5)
pH: 6 (ref 5.0–8.0)

## 2019-01-29 NOTE — ED Triage Notes (Signed)
Patient ambulatory to triage with steady gait, without difficulty or distress noted, mask in place; pt reports urinary frequency x wk; denies abd pain

## 2019-01-30 ENCOUNTER — Emergency Department
Admission: EM | Admit: 2019-01-30 | Discharge: 2019-01-30 | Disposition: A | Discharge status: Home / Self Care | Payer: BLUE CROSS/BLUE SHIELD | Attending: Emergency Medicine | Admitting: Emergency Medicine

## 2019-02-12 ENCOUNTER — Encounter: Payer: Self-pay | Admitting: Obstetrics and Gynecology

## 2019-02-12 ENCOUNTER — Ambulatory Visit: Payer: BLUE CROSS/BLUE SHIELD | Admitting: Obstetrics and Gynecology

## 2019-02-12 ENCOUNTER — Other Ambulatory Visit: Payer: Self-pay

## 2019-02-12 VITALS — BP 132/78 | Ht 65.0 in | Wt 265.0 lb

## 2019-02-12 DIAGNOSIS — N911 Secondary amenorrhea: Secondary | ICD-10-CM | POA: Insufficient documentation

## 2019-02-12 DIAGNOSIS — Z3202 Encounter for pregnancy test, result negative: Secondary | ICD-10-CM | POA: Diagnosis not present

## 2019-02-12 LAB — POCT URINE PREGNANCY: Preg Test, Ur: NEGATIVE

## 2019-02-12 MED ORDER — MEDROXYPROGESTERONE ACETATE 10 MG PO TABS: 10.0000 mg | ORAL_TABLET | Freq: Every day | ORAL | 0 refills | Status: DC

## 2019-02-12 NOTE — Progress Notes (Signed)
Obstetrics & Gynecology Office Visit   Chief Complaint  Patient presents with  . Vasomotor symptems    amenorrhea, vaginal dryness, low sex drive.   History of Present Illness: 33 y.o. G3P3 female who presents with no menses for a long time.  She had a Mirena IUD for five years and it was removed in 05/2018.  She had no menses with her IUD.  She received a Depo Provera injection the day she had the IUD removed.  She has still not had a menses since that time. She has had no subsequent Depo Provera injections since the one she had in October.  She denies any sexual activity since 04/2018.  She notes vaginal dryness since she got her first Depo injection.  Since she hasn't had intercourse since that time she states that she just feels different.  She states that she has no sex drive since around October 2019.  Her feet and legs swell.  She is actually on her feet less now than she was at that time.  She denies hot flashes.  She states her skin feels dry.  She denies hair changes.  She denies changes in her vision.  She denies galactorrhea.  She notes weight gain.  She has gained about 15 pounds since February.  She denies new hair growth on her face or anywhere else.  She denies new acne.   She states that [facility] checked her thyroid and she is up to date on her pap smears. She states that she [facility]. At one point she was taking what sounds like Saxenda for weight loss.  But, had too many GI side effects and has stopped taking it.      Past Medical History:  Diagnosis Date  . Depression     Past Surgical History:  Procedure Laterality Date  . NO PAST SURGERIES      Gynecologic History: No LMP recorded.  Doesn't remember  Obstetric History: G3P3, s/p SVD x 3 (2011, 2013, 2014)  Family History  Problem Relation Age of Onset  . Lung cancer Maternal Grandmother   . Uterine cancer Maternal Grandfather [id number]       pt has contact    Social History    Socioeconomic History  . Marital status: Single    Spouse name: Not on file  . Number of children: Not on file  . Years of education: Not on file  . Highest education level: Not on file  Occupational History  . Not on file  Social Needs  . Financial resource strain: Not on file  . Food insecurity    Worry: Not on file    Inability: Not on file  . Transportation needs    Medical: Not on file    Non-medical: Not on file  Tobacco Use  . Smoking status: Current Some Day Smoker    Packs/day: 0.00    Types: Cigarettes    Last attempt to quit: 09/25/2016    Years since quitting: 2.3  . Smokeless tobacco: Never Used  Substance and Sexual Activity  . Alcohol use: Yes    Comment: occ  . Drug use: No  . Sexual activity: Not Currently    Birth control/protection: None  Lifestyle  . Physical activity    Days per week: Not on file    Minutes per session: Not on file  . Stress: Not on file  Relationships  . Social connections    Talks on phone: Not on  file    Gets together: Not on file    Attends religious service: Not on file    Active member of club or organization: Not on file    Attends meetings of clubs or organizations: Not on file    Relationship status: Not on file  . Intimate partner violence    Fear of current or ex partner: Not on file    Emotionally abused: Not on file    Physically abused: Not on file    Forced sexual activity: Not on file  Other Topics Concern  . Not on file  Social History Narrative  . Not on file   Allergies: No Known Allergies  Prior to Admission medications   Medication Sig Start Date End Date Taking? Authorizing Provider  naproxen (NAPROSYN) 500 MG tablet Take 1 tablet (500 mg total) by mouth 2 (two) times daily with a meal. Patient not taking: Reported on 02/12/2019 09/27/18   Joni ReiningSmith, Ronald K, PA-C    Review of Systems  Constitutional: Negative.   HENT: Negative.   Eyes: Negative.  Negative for double vision.  Respiratory: Negative.    Cardiovascular: Positive for leg swelling. Negative for chest pain, palpitations, orthopnea, claudication and PND.  Gastrointestinal: Negative.   Genitourinary: Negative.   Musculoskeletal: Positive for back pain. Negative for falls, joint pain, myalgias and neck pain.  Skin: Negative.   Neurological: Negative.  Negative for headaches.  Psychiatric/Behavioral: Negative.      Physical Exam BP 132/78 (BP Location: Left Arm, Patient Position: Sitting, Cuff Size: Large)   Ht 5\' 5"  (1.651 m)   Wt 265 lb (120.2 kg)   BMI 44.10 kg/m  No LMP recorded. Physical Exam Constitutional:      General: She is not in acute distress.    Appearance: Normal appearance. She is well-developed. She is obese.  HENT:     Head: Normocephalic and atraumatic.  Eyes:     General: No scleral icterus.    Conjunctiva/sclera: Conjunctivae normal.  Neck:     Musculoskeletal: Normal range of motion and neck supple.  Cardiovascular:     Rate and Rhythm: Normal rate and regular rhythm.     Heart sounds: No murmur. No friction rub. No gallop.   Pulmonary:     Effort: Pulmonary effort is normal. No respiratory distress.     Breath sounds: Normal breath sounds. No wheezing or rales.  Abdominal:     General: Bowel sounds are normal. There is no distension.     Palpations: Abdomen is soft. There is no mass.     Tenderness: There is no abdominal tenderness. There is no guarding or rebound.  Musculoskeletal: Normal range of motion.  Neurological:     General: No focal deficit present.     Mental Status: She is alert and oriented to person, place, and time.     Cranial Nerves: No cranial nerve deficit.  Skin:    General: Skin is warm and dry.     Findings: No erythema.  Psychiatric:        Mood and Affect: Mood normal.        Behavior: Behavior normal.        Judgment: Judgment normal.    Urine Pregnancy Test: negative  Assessment: 33 y.o. 633P3 female here for  1. Secondary amenorrhea      Plan: Problem  List Items Addressed This Visit      Other   Secondary amenorrhea - Primary   Relevant Medications   medroxyPROGESTERone (PROVERA) 10  MG tablet   Other Relevant Orders   POCT urine pregnancy (Completed)     There are multiple potential etiologies to her current symptoms.  Given the timing of the symptoms in relation to the use of the Mirena and the Depo-Provera injection, will attempt to force menstruation with a withdrawal challenge using Provera.  If this fails, will need to perform a more thorough secondary amenorrhea work-up.  She is not pregnant today.  She has not been sexually active and nearly a year.  Though, she states her sex drive is very low at this point.  She will call, if she has not had a menstruation within 2 weeks after completion of the Provera medication.  20 minutes spent in face to face discussion with > 50% spent in counseling,management, and coordination of care of her secondary amenorrhea.   Prentice Docker, MD 02/12/2019 9:13 AM

## 2019-03-08 ENCOUNTER — Emergency Department: Payer: BLUE CROSS/BLUE SHIELD

## 2019-03-08 ENCOUNTER — Other Ambulatory Visit: Payer: Self-pay

## 2019-03-08 ENCOUNTER — Encounter: Payer: Self-pay | Admitting: Emergency Medicine

## 2019-03-08 ENCOUNTER — Emergency Department
Admission: EM | Admit: 2019-03-08 | Discharge: 2019-03-08 | Disposition: A | Discharge status: Home / Self Care | Payer: BLUE CROSS/BLUE SHIELD | Attending: Emergency Medicine | Admitting: Emergency Medicine

## 2019-03-08 DIAGNOSIS — R0789 Other chest pain: Secondary | ICD-10-CM | POA: Insufficient documentation

## 2019-03-08 DIAGNOSIS — F17210 Nicotine dependence, cigarettes, uncomplicated: Secondary | ICD-10-CM | POA: Insufficient documentation

## 2019-03-08 DIAGNOSIS — J039 Acute tonsillitis, unspecified: Secondary | ICD-10-CM

## 2019-03-08 DIAGNOSIS — J029 Acute pharyngitis, unspecified: Secondary | ICD-10-CM | POA: Diagnosis present

## 2019-03-08 DIAGNOSIS — Z20828 Contact with and (suspected) exposure to other viral communicable diseases: Secondary | ICD-10-CM | POA: Diagnosis not present

## 2019-03-08 DIAGNOSIS — R079 Chest pain, unspecified: Secondary | ICD-10-CM

## 2019-03-08 LAB — CBC
HCT: 42.4 % (ref 36.0–46.0)
Hemoglobin: 14.4 g/dL (ref 12.0–15.0)
MCH: 29.7 pg (ref 26.0–34.0)
MCHC: 34 g/dL (ref 30.0–36.0)
MCV: 87.4 fL (ref 80.0–100.0)
Platelets: 232 10*3/uL (ref 150–400)
RBC: 4.85 MIL/uL (ref 3.87–5.11)
RDW: 12.8 % (ref 11.5–15.5)
WBC: 13.1 10*3/uL — ABNORMAL HIGH (ref 4.0–10.5)
nRBC: 0 % (ref 0.0–0.2)

## 2019-03-08 LAB — BASIC METABOLIC PANEL
Anion gap: 5 (ref 5–15)
BUN: 12 mg/dL (ref 6–20)
CO2: 27 mmol/L (ref 22–32)
Calcium: 8.7 mg/dL — ABNORMAL LOW (ref 8.9–10.3)
Chloride: 108 mmol/L (ref 98–111)
Creatinine, Ser: 0.74 mg/dL (ref 0.44–1.00)
GFR calc Af Amer: >60 mL/min (ref >60)
GFR calc non Af Amer: >60 mL/min (ref >60)
Glucose, Bld: 115 mg/dL — ABNORMAL HIGH (ref 70–99)
Potassium: 3.6 mmol/L (ref 3.5–5.1)
Sodium: 140 mmol/L (ref 135–145)

## 2019-03-08 LAB — POCT PREGNANCY, URINE: Preg Test, Ur: NEGATIVE

## 2019-03-08 LAB — TROPONIN I (HIGH SENSITIVITY): Troponin I (High Sensitivity): 3 ng/L (ref <18)

## 2019-03-08 MED ORDER — DEXAMETHASONE 1 MG/ML PO CONC
10.0000 mg | Freq: Once | ORAL | Status: AC
  Administered 2019-03-08: 07:00:00 10 mg via ORAL
  Filled 2019-03-08: qty 10

## 2019-03-08 MED ORDER — AMOXICILLIN 500 MG PO CAPS: 500.0000 mg | ORAL_CAPSULE | Freq: Three times a day (TID) | ORAL | 0 refills | Status: DC

## 2019-03-08 MED ORDER — AMOXICILLIN 500 MG PO CAPS
500.0000 mg | ORAL_CAPSULE | Freq: Once | ORAL | Status: AC
  Administered 2019-03-08: 07:00:00 500 mg via ORAL
  Filled 2019-03-08: qty 1

## 2019-03-08 NOTE — ED Provider Notes (Signed)
[facility] Emergency Department Provider Note   ____________________________________________   First MD Initiated Contact with Patient 03/08/19 [phone]     (approximate)  I have reviewed the triage vital signs and the nursing notes.   HISTORY  Chief Complaint Chest Pain and Sore Throat    HPI Kimberly Arroyo is a 33 y.o. female who presents to the ED from home with a 2-day history of myalgias, sore throat, chest pain, burning tongue, loss of taste.  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 Denies fever, cough, abdominal pain, nausea, vomiting or diarrhea. Denies recent travel or trauma.       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 Authorizing Provider  amoxicillin (AMOXIL) 500 MG capsule Take 1 capsule (500 mg total) by mouth 3 (three) times daily. 03/08/19   Irean HongSung, Casha Estupinan J, MD  medroxyPROGESTERone (PROVERA) 10 MG tablet Take 1 tablet (10 mg total) by mouth daily for 10 days. 02/12/19 02/22/19  Conard NovakJackson, Stephen D, MD  naproxen (NAPROSYN) 500 MG tablet Take 1 tablet (500 mg total) by mouth 2 (two) times daily with a meal. Patient not taking: Reported on 02/12/2019 09/27/18   Joni ReiningSmith, Ronald K, PA-C    Allergies Patient has no known allergies.  Family History  Problem Relation Age of Onset   Lung cancer Maternal Grandmother    Uterine cancer Maternal Grandfather [id number]       pt has contact    Social History Social History   Tobacco Use   Smoking status: Current Some Day Smoker    Packs/day: 0.00    Types: Cigarettes    Last attempt to  quit: 09/25/2016    Years since quitting: 2.4   Smokeless tobacco: Never Used  Substance Use Topics   Alcohol use: Yes    Comment: occ   Drug use: No    Review of Systems  Constitutional: Positive for body aches.  No fever/chills Eyes: No visual changes. ENT: Positive for sore throat. Cardiovascular: Positive for chest pain. Respiratory: Denies shortness of breath. Gastrointestinal: No abdominal pain.  No nausea, no vomiting.  No diarrhea.  No constipation. Genitourinary: Negative for dysuria. Musculoskeletal: Negative for back pain. Skin: Negative for rash. Neurological: Negative for headaches, focal weakness or numbness.   ____________________________________________   PHYSICAL EXAM:  VITAL SIGNS: ED Triage Vitals  Enc Vitals Group     BP 03/08/19 0151 125/84     Pulse Rate 03/08/19 0151 66     Resp 03/08/19 0151 18     Temp 03/08/19 0151 98.8 F (37.1 C)     Temp Source 03/08/19 0151 Oral     SpO2 03/08/19 0151 99 %     Weight 03/08/19 0152 265 lb (120.2 kg)     Height 03/08/19 0152 5\' 5"  (1.651 m)     Head Circumference --      Peak Flow --      Pain Score 03/08/19 0152 5     Pain Loc --      Pain Edu? --      Excl.  in GC? --     Constitutional: Asleep, awakened for exam.  Alert and oriented. Well appearing and in no acute distress. Eyes: Conjunctivae are normal. PERRL. EOMI. Head: Atraumatic. Nose: No congestion/rhinnorhea. Mouth/Throat: Mucous membranes are moist.  Oropharynx mildly erythematous with bilateral and symmetrical tonsillar swelling with scattered exudates.  No peritonsillar abscess.  There is no hoarse or muffled voice.  There is no drooling. Neck: No stridor.  Supple neck without meningismus. Cardiovascular: Normal rate, regular rhythm. Grossly normal heart sounds.  Good peripheral circulation. Respiratory: Normal respiratory effort.  No retractions. Lungs CTAB. Gastrointestinal: Soft and nontender. No distention. No abdominal bruits. No  CVA tenderness. Musculoskeletal: No lower extremity tenderness nor edema.  No joint effusions. Neurologic:  Normal speech and language. No gross focal neurologic deficits are appreciated. No gait instability. Skin:  Skin is warm, dry and intact. No rash noted.  No petechiae. Psychiatric: Mood and affect are normal. Speech and behavior are normal.  ____________________________________________   LABS (all labs ordered are listed, but only abnormal results are displayed)  Labs Reviewed  BASIC METABOLIC PANEL - Abnormal; Notable for the following components:      Result Value   Glucose, Bld 115 (*)    Calcium 8.7 (*)    All other components within normal limits  CBC - Abnormal; Notable for the following components:   WBC 13.1 (*)    All other components within normal limits  NOVEL CORONAVIRUS, NAA (HOSPITAL ORDER, SEND-OUT TO REF LAB)  POC URINE PREG, ED  POCT PREGNANCY, URINE  TROPONIN I (HIGH SENSITIVITY)   ____________________________________________  EKG  ED ECG REPORT I, Tyden Kann J, the attending physician, personally viewed and interpreted this ECG.   Date: 03/08/2019  EKG Time: 0147  Rate: 65  Rhythm: normal EKG, normal sinus rhythm  Axis: Normal  Intervals:none  ST&T Change: Nonspecific  ____________________________________________  RADIOLOGY  ED MD interpretation: No acute cardiopulmonary process  Official radiology report(s): Dg Chest Port 1 View  Result Date: 03/08/2019 CLINICAL DATA:  33 year old female with history of right-sided chest pain radiating from the front to the back. EXAM: PORTABLE CHEST 1 VIEW COMPARISON:  Chest x-ray 10/12/2015. FINDINGS: Lung volumes are normal. No consolidative airspace disease. No pleural effusions. No pneumothorax. No pulmonary nodule or mass noted. Pulmonary vasculature and the cardiomediastinal silhouette are within normal limits. IMPRESSION: No radiographic evidence of acute cardiopulmonary disease. Electronically Signed    By: Trudie Reedaniel  Entrikin M.D.   On: 03/08/2019 04:42    ____________________________________________   PROCEDURES  Procedure(s) performed (including Critical Care):  Procedures   ____________________________________________   INITIAL IMPRESSION / ASSESSMENT AND PLAN / ED COURSE  As part of my medical decision making, I reviewed the following data within the electronic MEDICAL RECORD NUMBER Nursing notes reviewed and incorporated, Labs reviewed, EKG interpreted, Old chart reviewed, Radiograph reviewed and Notes from prior ED visits     Kimberly Arroyo was evaluated in Emergency Department on 03/08/2019 for the symptoms described in the history of present illness. She was evaluated in the context of the global COVID-19 pandemic, which necessitated consideration that the patient might be at risk for infection with the SARS-CoV-2 virus that causes COVID-19. Institutional protocols and algorithms that pertain to the evaluation of patients at risk for COVID-19 are in a state of rapid change based on information released by regulatory bodies including the CDC and federal and state organizations. These policies and algorithms were followed during the patient's care in the ED.   33 year old female who presents  with the above symptoms.  Tonsillitis noted on clinical exam.  EKG and troponin are unremarkable.  Will administer single dose Decadron, start amoxicillin.  Send out COVID is pending.  Strict return precautions given.  Patient verbalizes understanding agrees with plan of care.      ____________________________________________   FINAL CLINICAL IMPRESSION(S) / ED DIAGNOSES  Final diagnoses:  Tonsillitis     ED Discharge Orders         Ordered    amoxicillin (AMOXIL) 500 MG capsule  3 times daily     03/08/19 2248           Note:  This document was prepared using Dragon voice recognition software and may include unintentional dictation errors.   Paulette Blanch, MD 03/08/19 [phone]

## 2019-03-08 NOTE — ED Notes (Signed)
Assessment: pt states onset yesterday of chest pain, headache, sore throat. Pt denies known fever, cough. Pt states she may have had COVID-19 exposure through [place]. Pt appears in no acute distress.

## 2019-03-08 NOTE — ED Triage Notes (Signed)
Pt ambulatory to triage with no difficulty. Pt reports started on Thursday with pain to her right chest that radiates from front to back.. Then her throat became sore and feels swollen. Pt states her tongue feels like it is "burning". Pt reports today her taste has been different. Pt works as a Scientist, water quality at [employer].

## 2019-03-08 NOTE — ED Notes (Signed)
Waiting on decadron from pharmacy

## 2019-03-08 NOTE — Discharge Instructions (Signed)
Quarantine yourself until COVID results come back, usually in 2 to 4 days.  Start antibiotic as prescribed (Amoxicillin).  Return to the ER for worsening symptoms, persistent vomiting, difficulty breathing or other concerns.

## 2019-03-11 ENCOUNTER — Telehealth: Payer: Self-pay

## 2019-03-11 LAB — NOVEL CORONAVIRUS, NAA (HOSP ORDER, SEND-OUT TO REF LAB; TAT 18-24 HRS): SARS-CoV-2, NAA: NOT DETECTED

## 2019-03-11 NOTE — Telephone Encounter (Signed)
Called and informed patient that test for Covid 19 was NEGATIVE. Discussed signs and symptoms of Covid 19 : fever, chills, respiratory symptoms, cough, ENT symptoms, sore throat, SOB, muscle pain, diarrhea, headache, loss of taste/smell, close exposure to COVID-19 patient. Pt instructed to call PCP if they develop the above signs and sx. Pt also instructed to call 911 if having respiratory issues/distress. Discussed MyChart enrollment. Pt verbalized understanding.  

## 2019-03-13 ENCOUNTER — Encounter: Payer: Self-pay | Admitting: Obstetrics & Gynecology

## 2019-03-13 ENCOUNTER — Ambulatory Visit: Payer: BLUE CROSS/BLUE SHIELD | Admitting: Obstetrics & Gynecology

## 2019-03-13 ENCOUNTER — Other Ambulatory Visit: Payer: Self-pay

## 2019-03-13 VITALS — BP 120/80 | Ht 65.0 in | Wt 266.0 lb

## 2019-03-13 DIAGNOSIS — N911 Secondary amenorrhea: Secondary | ICD-10-CM

## 2019-03-13 NOTE — Patient Instructions (Signed)
Polycystic Ovarian Syndrome  Polycystic ovarian syndrome (PCOS) is a common hormonal disorder among women of reproductive age. In most women with PCOS, many small fluid-filled sacs (cysts) grow on the ovaries, and the cysts are not part of a normal menstrual cycle. PCOS can cause problems with your menstrual periods and make it difficult to get pregnant. It can also cause an increased risk of miscarriage with pregnancy. If it is not treated, PCOS can lead to serious health problems, such as diabetes and heart disease. What are the causes? The cause of PCOS is not known, but it may be the result of a combination of certain factors, such as:  Irregular menstrual cycle.  High levels of certain hormones (androgens).  Problems with the hormone that helps to control blood sugar (insulin resistance).  Certain genes. What increases the risk? This condition is more likely to develop in women who have a family history of PCOS. What are the signs or symptoms? Symptoms of PCOS may include:  Multiple ovarian cysts.  Infrequent periods or no periods.  Periods that are too frequent or too heavy.  Unpredictable periods.  Inability to get pregnant (infertility) because of not ovulating.  Increased growth of hair on the face, chest, stomach, back, thumbs, thighs, or toes.  Acne or oily skin. Acne may develop during adulthood, and it may not respond to treatment.  Pelvic pain.  Weight gain or obesity.  Patches of thickened and dark brown or black skin on the neck, arms, breasts, or thighs (acanthosis nigricans).  Excess hair growth on the face, chest, abdomen, or upper thighs (hirsutism). How is this diagnosed? This condition is diagnosed based on:  Your medical history.  A physical exam, including a pelvic exam. Your health care provider may look for areas of increased hair growth on your skin.  Tests, such as: ? Ultrasound. This may be used to examine the ovaries and the lining of the  uterus (endometrium) for cysts. ? Blood tests. These may be used to check levels of sugar (glucose), female hormone (testosterone), and female hormones (estrogen and progesterone) in your blood. How is this treated? There is no cure for PCOS, but treatment can help to manage symptoms and prevent more health problems from developing. Treatment varies depending on:  Your symptoms.  Whether you want to have a baby or whether you need birth control (contraception). Treatment may include nutrition and lifestyle changes along with:  Progesterone hormone to start a menstrual period.  Birth control pills to help you have regular menstrual periods.  Medicines to make you ovulate, if you want to get pregnant.  Medicine to reduce excessive hair growth.  Surgery, in severe cases. This may involve making small holes in one or both of your ovaries. This decreases the amount of testosterone that your body produces. Follow these instructions at home:  Take over-the-counter and prescription medicines only as told by your health care provider.  Follow a healthy meal plan. This can help you reduce the effects of PCOS. ? Eat a healthy diet that includes lean proteins, complex carbohydrates, fresh fruits and vegetables, low-fat dairy products, and healthy fats. Make sure to eat enough fiber.  If you are overweight, lose weight as told by your health care provider. ? Losing 10% of your body weight may improve symptoms. ? Your health care provider can determine how much weight loss is best for you and can help you lose weight safely.  Keep all follow-up visits as told by your health care provider.   This is important. Contact a health care provider if:  Your symptoms do not get better with medicine.  You develop new symptoms. This information is not intended to replace advice given to you by your health care provider. Make sure you discuss any questions you have with your health care provider. Document  Released: 11/24/2004 Document Revised: 07/13/2017 Document Reviewed: 01/16/2016 Elsevier Patient Education  2020 Elsevier Inc.  

## 2019-03-13 NOTE — Progress Notes (Signed)
  History of Present Illness:  Kimberly Arroyo is a 33 y.o. who was started on  Provera to induce period approximately 3 weeks ago. Since that time, she states that her symptoms have led her to have a brief period, but no more than that and none since; she has not given it enough time and understands it may be related to prior Depo/Mirena use, but is concerned about weight gain, low libido, and other sx's to believe she has more going on.Marland Kitchen  PMHx: She  has a past medical history of Depression. Also,  has a past surgical history that includes No past surgeries., family history includes Lung cancer in her maternal grandmother; Uterine cancer (age of onset: 70) in her maternal grandfather.,  reports that she has been smoking cigarettes. She has been smoking about 0.00 packs per day. She has never used smokeless tobacco. She reports current alcohol use. She reports that she does not use drugs. No outpatient medications have been marked as taking for the 03/13/19 encounter (Office Visit) with Gae Dry, MD.  . Also, has No Known Allergies..  Review of Systems  All other systems reviewed and are negative.   Physical Exam:  BP 120/80   Ht 5\' 5"  (1.651 m)   Wt 266 lb (120.7 kg)   LMP 02/20/2019   BMI 44.26 kg/m  Body mass index is 44.26 kg/m. Constitutional: Well nourished, well developed female in no acute distress.  Abdomen: diffusely non tender to palpation, non distended, and no masses, hernias Neuro: Grossly intact Psych:  Normal mood and affect.    Assessment:  Problem List Items Addressed This Visit      Other   Secondary amenorrhea - Primary   Relevant Orders   FSH/LH   TSH   Testosterone, Free, Total, SHBG   DHEA-sulfate   Beta hCG quant (ref lab)   Estradiol   US PELVIC COMPLETE WITH TRANSVAGINAL     Plan: Discussed possible PCOS and other etiologies, as well as lingering side effect of progesterone contraceptive therapies.  Labs and [place] to help guide plan of management.   Not trying for pregnancy yet.  A total of 15 minutes were spent face-to-face with the patient during this encounter and over half of that time dealt with counseling and coordination of care.  Barnett Applebaum, MD, [facility], [facility] 03/13/2019  3:57 PM

## 2019-03-15 LAB — TESTOSTERONE, FREE, TOTAL, SHBG
Sex Hormone Binding: 38.8 nmol/L (ref 24.6–122.0)
Testosterone, Free: 3.2 pg/mL (ref 0.0–4.2)
Testosterone: 44 ng/dL (ref 8–48)

## 2019-03-15 LAB — BETA HCG QUANT (REF LAB): hCG Quant: <1 m[IU]/mL

## 2019-03-15 LAB — FSH/LH
FSH: 3.7 m[IU]/mL
LH: 10.8 m[IU]/mL

## 2019-03-15 LAB — TSH: TSH: 1.69 u[IU]/mL (ref 0.450–4.500)

## 2019-03-15 LAB — DHEA-SULFATE: DHEA-SO4: 339.7 ug/dL (ref 84.8–378.0)

## 2019-03-15 LAB — ESTRADIOL: Estradiol: 56.1 pg/mL

## 2019-03-27 ENCOUNTER — Other Ambulatory Visit: Payer: Self-pay

## 2019-03-27 ENCOUNTER — Ambulatory Visit (INDEPENDENT_AMBULATORY_CARE_PROVIDER_SITE_OTHER): Payer: BLUE CROSS/BLUE SHIELD

## 2019-03-27 ENCOUNTER — Ambulatory Visit (INDEPENDENT_AMBULATORY_CARE_PROVIDER_SITE_OTHER): Payer: BLUE CROSS/BLUE SHIELD | Admitting: Obstetrics & Gynecology

## 2019-03-27 ENCOUNTER — Ambulatory Visit: Payer: BLUE CROSS/BLUE SHIELD | Admitting: Obstetrics & Gynecology

## 2019-03-27 ENCOUNTER — Encounter: Payer: Self-pay | Admitting: Obstetrics & Gynecology

## 2019-03-27 VITALS — BP 120/80 | Ht 65.0 in | Wt 265.0 lb

## 2019-03-27 DIAGNOSIS — N939 Abnormal uterine and vaginal bleeding, unspecified: Secondary | ICD-10-CM | POA: Diagnosis not present

## 2019-03-27 DIAGNOSIS — N911 Secondary amenorrhea: Secondary | ICD-10-CM

## 2019-03-27 MED ORDER — NORETHIN ACE-ETH ESTRAD-FE 1-20 MG-MCG PO TABS: 1.0000 | ORAL_TABLET | Freq: Every day | ORAL | 11 refills | Status: DC

## 2019-03-27 NOTE — Progress Notes (Signed)
HPI: No periods and weight gain, also vag dryness. Also, concern for future fertility (not now) Labs reviewed Ultrasound demonstrates no masses seen These findings are Pelvis normal  PMHx: She  has a past medical history of Depression. Also,  has a past surgical history that includes No past surgeries., family history includes Lung cancer in her maternal grandmother; Uterine cancer (age of onset: 4970) in her maternal grandfather.,  reports that she has been smoking cigarettes. She has been smoking about 0.00 packs per day. She has never used smokeless tobacco. She reports current alcohol use. She reports that she does not use drugs.  She has a current medication list which includes the following prescription(s): norethindrone-ethinyl estradiol. Also, has No Known Allergies.  Review of Systems  All other systems reviewed and are negative.  Results for orders placed or performed in visit on 03/13/19  FSH/LH  Result Value Ref Range   LH 10.8 mIU/mL   FSH 3.7 mIU/mL  TSH  Result Value Ref Range   TSH 1.690 0.450 - 4.500 uIU/mL  Testosterone, Free, Total, SHBG  Result Value Ref Range   Testosterone 44 8 - 48 ng/dL   Testosterone, Free 3.2 0.0 - 4.2 pg/mL   Sex Hormone Binding 38.8 24.6 - 122.0 nmol/L  DHEA-sulfate  Result Value Ref Range   DHEA-SO4 339.7 84.8 - 378.0 ug/dL  Beta hCG quant (ref lab)  Result Value Ref Range   hCG Quant <1 mIU/mL  Estradiol  Result Value Ref Range   Estradiol 56.1 pg/mL   Objective: BP 120/80   Ht 5\' 5"  (1.651 m)   Wt 265 lb (120.2 kg)   BMI 44.10 kg/m   Physical examination Constitutional NAD, Conversant  Skin No rashes, lesions or ulceration.   Extremities: Moves all appropriately.  Normal ROM for age. No lymphadenopathy.  Neuro: Grossly intact  Psych: Oriented to PPT.  Normal mood. Normal affect.   [place] Pelvic Complete With Transvaginal  Result Date: 03/27/2019 Patient Name: Kimberly DoffingShayla Hankinson DOB: 1986-07-09 MRN: [id number] ULTRASOUND REPORT  Location: [facility] Date of Service: 03/27/2019 Indications:Abnormal Uterine Bleeding Findings: The uterus is anteverted and measures 7.9 x 4.7 x 3.9 cm. Echo texture is homogenous without evidence of focal masses. The Endometrium measures 4.7 mm. Right Ovary measures 3.2 x 3.1 x 2.7 cm. It is normal in appearance. Left Ovary measures 4.4 x 2.5 x 2.3 cm. It is normal in appearance. Survey of the adnexa demonstrates no adnexal masses. There is no free fluid in the cul de sac. Impression: 1. Normal pelvic ultrasound. Recommendations: 1.Clinical correlation with the patient's History and Physical Exam. Deanna ArtisElyse S Fairbanks, RT Review of ULTRASOUND.    I have personally reviewed images and report of recent ultrasound done [facility].    Plan of management to be discussed with patient. Annamarie MajorPaul Mima Cranmore, MD, FACOG [facility], [facility] 03/27/2019  3:29 PM   Assessment:  Secondary amenorrhea - Plan: norethindrone-ethinyl estradiol (JUNEL FE 1/20) 1-20 MG-MCG tablet  Take OCP regularly for hormone control    May help w periods, weight gain, libido, dryness    May help w future fertility attempts  OCPs The risks /benefits of OCPs have been explained to the patient in detail.  Product literature has been given to her.  I have instructed her in the use of OCPs and have given her literature reinforcing this information.  I have explained to the patient that OCPs are not as effective for birth control during the first month of use,  and that another form of contraception should be used during this time.  Both first-day start and Sunday start have been explained.  The risks and benefits of each was discussed.  She has been made aware of  the fact that other medications may affect the efficacy of OCPs.  I have answered all of her questions, and I believe that she has an understanding of the effectiveness and use of OCPs.   Barnett Applebaum, MD, [facility], [facility] 03/27/2019   3:39 PM

## 2019-06-01 ENCOUNTER — Emergency Department
Admission: EM | Admit: 2019-06-01 | Discharge: 2019-06-01 | Discharge status: Against Medical Advice | Payer: BLUE CROSS/BLUE SHIELD

## 2019-06-01 ENCOUNTER — Other Ambulatory Visit: Payer: Self-pay

## 2019-06-01 NOTE — ED Triage Notes (Signed)
Patient presents to ED with made up complaints of abdominal pain so that she can sit with her "husband" who is checking in for anxiety complaints. Patient became upset when she was told she could not wait with him. Patient is now stating during the middle of triage that she wants to go to another hospital.

## 2019-06-01 NOTE — ED Notes (Signed)
Pt checked in to be seen for chest pain after she was told she could not wait in the waiting; pt heard on the phone talking with someone, telling them she had to check herself into the hospital to sit with "Dub Mikes" while he has a panic attack; Heather, charge RN out to speak with her and the man that is having a panic attack

## 2019-06-17 ENCOUNTER — Other Ambulatory Visit: Payer: Self-pay | Admitting: *Deleted

## 2019-06-17 DIAGNOSIS — Z20822 Contact with and (suspected) exposure to covid-19: Secondary | ICD-10-CM

## 2019-06-18 LAB — NOVEL CORONAVIRUS, NAA: SARS-CoV-2, NAA: NOT DETECTED

## 2019-06-26 ENCOUNTER — Encounter: Payer: Self-pay | Admitting: Emergency Medicine

## 2019-06-26 ENCOUNTER — Other Ambulatory Visit: Payer: Self-pay

## 2019-06-26 ENCOUNTER — Emergency Department
Admission: EM | Admit: 2019-06-26 | Discharge: 2019-06-27 | Disposition: A | Discharge status: Home / Self Care | Payer: BLUE CROSS/BLUE SHIELD | Attending: Emergency Medicine | Admitting: Emergency Medicine

## 2019-06-26 DIAGNOSIS — F17210 Nicotine dependence, cigarettes, uncomplicated: Secondary | ICD-10-CM | POA: Diagnosis not present

## 2019-06-26 DIAGNOSIS — Z3A01 Less than 8 weeks gestation of pregnancy: Secondary | ICD-10-CM | POA: Insufficient documentation

## 2019-06-26 DIAGNOSIS — Z793 Long term (current) use of hormonal contraceptives: Secondary | ICD-10-CM | POA: Insufficient documentation

## 2019-06-26 DIAGNOSIS — R102 Pelvic and perineal pain: Secondary | ICD-10-CM | POA: Diagnosis not present

## 2019-06-26 DIAGNOSIS — Z3491 Encounter for supervision of normal pregnancy, unspecified, first trimester: Secondary | ICD-10-CM

## 2019-06-26 DIAGNOSIS — R1032 Left lower quadrant pain: Secondary | ICD-10-CM

## 2019-06-26 DIAGNOSIS — N899 Noninflammatory disorder of vagina, unspecified: Secondary | ICD-10-CM | POA: Insufficient documentation

## 2019-06-26 DIAGNOSIS — O99891 Other specified diseases and conditions complicating pregnancy: Secondary | ICD-10-CM | POA: Insufficient documentation

## 2019-06-26 LAB — POCT PREGNANCY, URINE: Preg Test, Ur: POSITIVE — AB

## 2019-06-26 NOTE — ED Triage Notes (Signed)
Pt in via POV, states, "I just recently found out I was pregnant and I think I have an infection.  It just smells down there."  Reports last menstrual cycle October.  Denies any vaginal discharge or bleeding.  Denies any pain.  NAD noted at this time.

## 2019-06-26 NOTE — ED Notes (Signed)
Pt states she found out she was pregnant 3 days ago and noticed vaginal smell 3 days ago. Pt does not know how far along she is.

## 2019-06-27 ENCOUNTER — Emergency Department: Payer: BLUE CROSS/BLUE SHIELD

## 2019-06-27 ENCOUNTER — Encounter: Payer: Self-pay | Admitting: Radiology

## 2019-06-27 LAB — CBC
HCT: 41.7 % (ref 36.0–46.0)
Hemoglobin: 14.5 g/dL (ref 12.0–15.0)
MCH: 29.8 pg (ref 26.0–34.0)
MCHC: 34.8 g/dL (ref 30.0–36.0)
MCV: 85.6 fL (ref 80.0–100.0)
Platelets: 241 10*3/uL (ref 150–400)
RBC: 4.87 MIL/uL (ref 3.87–5.11)
RDW: 13.5 % (ref 11.5–15.5)
WBC: 15.8 10*3/uL — ABNORMAL HIGH (ref 4.0–10.5)
nRBC: 0 % (ref 0.0–0.2)

## 2019-06-27 LAB — BASIC METABOLIC PANEL
Anion gap: 10 (ref 5–15)
BUN: 12 mg/dL (ref 6–20)
CO2: 23 mmol/L (ref 22–32)
Calcium: 9.1 mg/dL (ref 8.9–10.3)
Chloride: 106 mmol/L (ref 98–111)
Creatinine, Ser: 0.66 mg/dL (ref 0.44–1.00)
GFR calc Af Amer: >60 mL/min (ref >60)
GFR calc non Af Amer: >60 mL/min (ref >60)
Glucose, Bld: 102 mg/dL — ABNORMAL HIGH (ref 70–99)
Potassium: 3.8 mmol/L (ref 3.5–5.1)
Sodium: 139 mmol/L (ref 135–145)

## 2019-06-27 LAB — WET PREP, GENITAL
Clue Cells Wet Prep HPF POC: NONE SEEN
Sperm: NONE SEEN
Trich, Wet Prep: NONE SEEN
Yeast Wet Prep HPF POC: NONE SEEN

## 2019-06-27 LAB — HCG, QUANTITATIVE, PREGNANCY: hCG, Beta Chain, Quant, S: 2243 m[IU]/mL — ABNORMAL HIGH (ref <5)

## 2019-06-27 NOTE — ED Provider Notes (Signed)
[facility] Emergency Department Provider Note   First MD Initiated Contact with Patient 06/26/19 2355     (approximate)  I have reviewed the triage vital signs and the nursing notes.   HISTORY  Chief Complaint Vaginal Odor   HPI Kimberly Arroyo is a 33 y.o. female G30, P3 currently pregnant unknown gestational age (patient has PCOS and cannot recall last menstrual period) presents to the emergency department secondary to pelvic discomfort and vaginal odor.  Patient states that she is only had 1 sexual partner over the course of the last year and that "he does not have any symptoms that she knows of".  Patient denies any fever no nausea or vomiting.  Patient denies any rash.  Patient denies any joint pains.        Past Medical History:  Diagnosis Date  . Depression     Patient Active Problem List   Diagnosis Date Noted  . 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 Depression, major, single episode, moderate ([place]) 02/24/2016  . Generalized anxiety disorder 02/24/2016  . Involuntary commitment 02/24/2016    Past Surgical History:  Procedure Laterality Date  . NO PAST SURGERIES      Prior to Admission medications   Medication Sig Start Date End Date Taking? Authorizing Provider  norethindrone-ethinyl estradiol (JUNEL FE 1/20) 1-20 MG-MCG tablet Take 1 tablet by mouth daily. 03/27/19   Gae Dry, MD    Allergies Patient has no known allergies.  Family History  Problem Relation Age of Onset  . Lung cancer Maternal Grandmother   . Uterine cancer Maternal Grandfather 70       pt has contact    Social History Social History   Tobacco Use  . Smoking status: Current Some Day Smoker    Packs/day: 0.00    Types: Cigarettes    Last attempt to quit: 09/25/2016    Years since quitting: 2.7  . Smokeless tobacco: Never Used  Substance Use Topics  . Alcohol use: Yes  . Drug use: No    Review of Systems Constitutional: No fever/chills Eyes: No  visual changes. ENT: No sore throat. Cardiovascular: Denies chest pain. Respiratory: Denies shortness of breath. Gastrointestinal: No abdominal pain.  No nausea, no vomiting.  No diarrhea.  No constipation. Genitourinary: Negative for dysuria.  Positive for vaginal discharge Musculoskeletal: Negative for neck pain.  Negative for back pain. Integumentary: Negative for rash. Neurological: Negative for headaches, focal weakness or numbness.   ____________________________________________   PHYSICAL EXAM:  VITAL SIGNS: ED Triage Vitals [06/26/19 2210]  Enc Vitals Group     BP (!) 137/93     Pulse Rate 91     Resp 16     Temp 99.6 F (37.6 C)     Temp Source Oral     SpO2 99 %     Weight 114.8 kg (253 lb)     Height 1.651 m (5\' 5" )     Head Circumference      Peak Flow      Pain Score 0     Pain Loc      Pain Edu?      Excl. in [place]?    Constitutional: Alert and oriented.  Eyes: Conjunctivae are normal.  Mouth/Throat: Patient is wearing a mask. Neck: No stridor.  No meningeal signs.   Cardiovascular: Normal rate, regular rhythm. Good peripheral circulation. Grossly normal heart sounds. Respiratory: Normal respiratory effort.  No retractions. Gastrointestinal: Left pelvic pain. No distention.  Genitourinary: Malodorous yellowish-white  vaginal discharge Musculoskeletal: No lower extremity tenderness nor edema. No gross deformities of extremities. Neurologic:  Normal speech and language. No gross focal neurologic deficits are appreciated.  Skin:  Skin is warm, dry and intact. Psychiatric: Mood and affect are normal. Speech and behavior are normal.  ____________________________________________   LABS (all labs ordered are listed, but only abnormal results are displayed)  Labs Reviewed  WET PREP, GENITAL - Abnormal; Notable for the following components:      Result Value   WBC, Wet Prep HPF POC FEW (*)    All other components within normal limits  HCG, QUANTITATIVE,  PREGNANCY - Abnormal; Notable for the following components:   hCG, Beta Chain, Quant, S 2,243 (*)    All other components within normal limits  CBC - Abnormal; Notable for the following components:   WBC 15.8 (*)    All other components within normal limits  BASIC METABOLIC PANEL - Abnormal; Notable for the following components:   Glucose, Bld 102 (*)    All other components within normal limits  POCT PREGNANCY, URINE - Abnormal; Notable for the following components:   Preg Test, Ur POSITIVE (*)    All other components within normal limits  GC/CHLAMYDIA PROBE AMP  POC URINE PREG, ED    ____________________________________________  RADIOLOGY I, Sherwood N Kersten Salmons, personally viewed and evaluated these images (plain radiographs) as part of my medical decision making, as well as reviewing the written report by the radiologist.  ED MD interpretation: 5 weeks 2-day early intrauterine gestational sac identified without no yolk sac or fetal pole or cardiac activity yet visualized per radiologist on ultrasound interpretation.  Official radiology report(s): [place] Ob Less Than 14 Weeks With Ob Transvaginal  Result Date: 06/27/2019 CLINICAL DATA:  Left pelvic pain for 1 day EXAM: OBSTETRIC <14 WK US AND TRANSVAGINAL OB US TECHNIQUE: Both transabdominal and transvaginal ultrasound examinations were performed for complete evaluation of the gestation as well as the maternal uterus, adnexal regions, and pelvic cul-de-sac. Transvaginal technique was performed to assess early pregnancy. COMPARISON:  None. FINDINGS: Intrauterine gestational sac: Single Yolk sac:  Not Visualized. Embryo:  Not Visualized. MSD: 5.4 mm   5 w   2 d Subchorionic hemorrhage:  None visualized. Maternal uterus/adnexae: A probable corpus luteum cyst seen within the left ovary. The right ovary is unremarkable. IMPRESSION: Probable early intrauterine gestational sac, but no yolk sac, fetal pole, or cardiac activity yet visualized. Recommend  follow-up quantitative B-HCG levels and follow-up US in 14 days to assess viability. This recommendation follows SRU consensus guidelines: Diagnostic Criteria for Nonviable Pregnancy Early in the First Trimester. Malva Limes Engl J Med 2013; [id number]:1191-47; 369:1443-51. Electronically Signed   By: Jonna ClarkBindu  Avutu M.D.   On: 06/27/2019 01:40     Procedures   ____________________________________________   INITIAL IMPRESSION / MDM / ASSESSMENT AND PLAN / ED COURSE  As part of my medical decision making, I reviewed the following data within the electronic MEDICAL RECORD NUMBER   33 year old female presented with above-stated history and physical exam secondary to vaginal odor and left pelvic discomfort.  Wet prep negative gonorrhea/chlamydia pending.  Ultrasound revealed approximate 5-week 2 days gestational sac without yolk sac or fetal pole or cardiac activity.  Spoke with the patient at length regarding the necessity of follow-up with OB/GYN Dr. Valentino Saxonherry .  ____________________________________________  FINAL CLINICAL IMPRESSION(S) / ED DIAGNOSES  Final diagnoses:  First trimester pregnancy     MEDICATIONS GIVEN DURING THIS VISIT:  Medications - No data to display  ED Discharge Orders    None      *Please note:  Kimberly Arroyo was evaluated in Emergency Department on 06/27/2019 for the symptoms described in the history of present illness. She was evaluated in the context of the global COVID-19 pandemic, which necessitated consideration that the patient might be at risk for infection with the SARS-CoV-2 virus that causes COVID-19. Institutional protocols and algorithms that pertain to the evaluation of patients at risk for COVID-19 are in a state of rapid change based on information released by regulatory bodies including the CDC and federal and state organizations. These policies and algorithms were followed during the patient's care in the ED.  Some ED evaluations and interventions may be delayed as a result of limited  staffing during the pandemic.*  Note:  This document was prepared using Dragon voice recognition software and may include unintentional dictation errors.   Darci Current, MD 06/27/19 [phone]

## 2019-06-29 ENCOUNTER — Other Ambulatory Visit: Payer: Self-pay

## 2019-06-29 ENCOUNTER — Emergency Department
Admission: EM | Admit: 2019-06-29 | Discharge: 2019-06-29 | Disposition: A | Discharge status: Home / Self Care | Payer: BLUE CROSS/BLUE SHIELD | Attending: Emergency Medicine | Admitting: Emergency Medicine

## 2019-06-29 ENCOUNTER — Encounter: Payer: Self-pay | Admitting: Emergency Medicine

## 2019-06-29 DIAGNOSIS — O99331 Smoking (tobacco) complicating pregnancy, first trimester: Secondary | ICD-10-CM | POA: Insufficient documentation

## 2019-06-29 DIAGNOSIS — Z3A01 Less than 8 weeks gestation of pregnancy: Secondary | ICD-10-CM | POA: Diagnosis not present

## 2019-06-29 DIAGNOSIS — F17210 Nicotine dependence, cigarettes, uncomplicated: Secondary | ICD-10-CM | POA: Diagnosis not present

## 2019-06-29 DIAGNOSIS — O3461 Maternal care for abnormality of vagina, first trimester: Secondary | ICD-10-CM | POA: Insufficient documentation

## 2019-06-29 DIAGNOSIS — N898 Other specified noninflammatory disorders of vagina: Secondary | ICD-10-CM

## 2019-06-29 LAB — URINALYSIS, COMPLETE (UACMP) WITH MICROSCOPIC
Bacteria, UA: NONE SEEN
Bilirubin Urine: NEGATIVE
Glucose, UA: NEGATIVE mg/dL
Hgb urine dipstick: NEGATIVE
Ketones, ur: NEGATIVE mg/dL
Leukocytes,Ua: NEGATIVE
Nitrite: NEGATIVE
Protein, ur: NEGATIVE mg/dL
Specific Gravity, Urine: 1.02 (ref 1.005–1.030)
pH: 5 (ref 5.0–8.0)

## 2019-06-29 NOTE — ED Notes (Signed)
Pt states she is leaving cause "they're not going to do anything". Pt walked out.

## 2019-06-29 NOTE — ED Provider Notes (Signed)
[facility] Emergency Department Provider Note  ____________________________________________  Time seen: Approximately 9:16 PM  I have reviewed the triage vital signs and the nursing notes.   HISTORY  Chief Complaint vaginal dryness    HPI Kimberly Arroyo is a 33 y.o. female presents to the emergency department with concern for vaginal dryness.  Patient states that she was engaging in sexual intercourse this evening with her significant other and was not able to proceed due to vaginal dryness.  Patient states that she is very distressed that she has never had vaginal dryness before she is concerned that her pregnancy is causing the issue.  Patient denies vaginal bleeding, pelvic pain, pelvic cramping, fever or chills.  Patient was seen and evaluated 2 days ago and had an extensive work-up including labs and pelvic ultrasound which was reassuring.  No other alleviating measures have been attempted.  Patient denies suicidal or homicidal ideation.        Past Medical History:  Diagnosis Date  . Depression     Patient Active Problem List   Diagnosis Date Noted  . 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 Depression, major, single episode, moderate ([facility]) 02/24/2016  . Generalized anxiety disorder 02/24/2016  . Involuntary commitment 02/24/2016    Past Surgical History:  Procedure Laterality Date  . NO PAST SURGERIES      Prior to Admission medications   Medication Sig Start Date End Date Taking? Authorizing Provider  norethindrone-ethinyl estradiol (JUNEL FE 1/20) 1-20 MG-MCG tablet Take 1 tablet by mouth daily. 03/27/19   Nadara Mustard, MD    Allergies Patient has no known allergies.  Family History  Problem Relation Age of Onset  . Lung cancer Maternal Grandmother   . Uterine cancer Maternal Grandfather 55       pt has contact    Social History Social History   Tobacco Use  . Smoking status: Current Some Day Smoker    Packs/day: 0.00    Types:  Cigarettes    Last attempt to quit: 09/25/2016    Years since quitting: 2.7  . Smokeless tobacco: Never Used  Substance Use Topics  . Alcohol use: Yes    Comment: occ  . Drug use: No     Review of Systems  Constitutional: No fever/chills Eyes: No visual changes. No discharge ENT: No upper respiratory complaints. Cardiovascular: no chest pain. Respiratory: no cough. No SOB. Gastrointestinal: No abdominal pain.  No nausea, no vomiting.  No diarrhea.  No constipation. Genitourinary: Patient has vaginal dryness. Musculoskeletal: Negative for musculoskeletal pain. Skin: Negative for rash, abrasions, lacerations, ecchymosis. Neurological: Negative for headaches, focal weakness or numbness.  ____________________________________________   PHYSICAL EXAM:  VITAL SIGNS: ED Triage Vitals  Enc Vitals Group     BP 06/29/19 1914 (!) 172/66     Pulse Rate 06/29/19 1914 97     Resp 06/29/19 1914 18     Temp 06/29/19 1914 98.9 F (37.2 C)     Temp Source 06/29/19 1914 Oral     SpO2 06/29/19 1914 99 %     Weight 06/29/19 1913 256 lb (116.1 kg)     Height 06/29/19 1913 5\' 5"  (1.651 m)     Head Circumference --      Peak Flow --      Pain Score 06/29/19 1913 0     Pain Loc --      Pain Edu? --      Excl. in GC? --      Constitutional: Alert  and oriented. Well appearing and in no acute distress. Eyes: Conjunctivae are normal. PERRL. EOMI. Head: Atraumatic. ENT: Cardiovascular: Normal rate, regular rhythm. Normal S1 and S2.  Good peripheral circulation. Respiratory: Normal respiratory effort without tachypnea or retractions. Lungs CTAB. Good air entry to the bases with no decreased or absent breath sounds. Gastrointestinal: Bowel sounds 4 quadrants. Soft and nontender to palpation. No guarding or rigidity. No palpable masses. No distention. No CVA tenderness. Musculoskeletal: Full range of motion to all extremities. No gross deformities appreciated. Neurologic:  Normal speech and  language. No gross focal neurologic deficits are appreciated.  Skin:  Skin is warm, dry and intact. No rash noted. Psychiatric: Mood and affect are normal. Speech and behavior are normal. Patient exhibits appropriate insight and judgement.   ____________________________________________   LABS (all labs ordered are listed, but only abnormal results are displayed)  Labs Reviewed  URINALYSIS, COMPLETE (UACMP) WITH MICROSCOPIC - Abnormal; Notable for the following components:      Result Value   Color, Urine YELLOW (*)    APPearance CLEAR (*)    All other components within normal limits   ____________________________________________  EKG   ____________________________________________  RADIOLOGY   No results found.  ____________________________________________    PROCEDURES  Procedure(s) performed:    Procedures    Medications - No data to display   ____________________________________________   INITIAL IMPRESSION / ASSESSMENT AND PLAN / ED COURSE  Pertinent labs & imaging results that were available during my care of the patient were reviewed by me and considered in my medical decision making (see chart for details).  Review of the [place] CSRS was performed in accordance of the [place] prior to dispensing any controlled drugs.           Assessment and plan Vaginal dryness 33 year old female presents to the emergency department with concern for vaginal dryness that started acutely tonight.  On physical exam, patient is tearful and upset due to current symptoms.  Patient states that she has never experienced that her significant other is upset with her and is made her feel bad tonight.  She denies vaginal bleeding, pelvic pain, abdominal pain or fever or chills at home.  No dysuria or hematuria.  Patient had an extensive work-up 2 days ago with reassuring labs and pelvic ultrasound.  No further work-up is warranted at this time.  Patient education regarding  lubricants for vaginal dryness was given.  Return precautions were given.  All patient questions were answered.    ____________________________________________  FINAL CLINICAL IMPRESSION(S) / ED DIAGNOSES  Final diagnoses:  Vaginal dryness      NEW MEDICATIONS STARTED DURING THIS VISIT:  ED Discharge Orders    None          This chart was dictated using voice recognition software/Dragon. Despite best efforts to proofread, errors can occur which can change the meaning. Any change was purely unintentional.    Lannie Fields, PA-C 06/29/19 2121    Arta Silence, MD 06/29/19 [phone]

## 2019-06-29 NOTE — ED Triage Notes (Signed)
Patient states that she is [redacted] weeks pregnant and that she has vaginal dryness that started today.

## 2019-06-29 NOTE — ED Notes (Signed)
Pt ambulatory to triage in NAD. Reports vaginal dryness with some discharge with mild lower back pain. Reports currently pregnant.

## 2019-07-02 LAB — GC/CHLAMYDIA PROBE AMP
Chlamydia trachomatis, NAA: NEGATIVE
Neisseria Gonorrhoeae by PCR: NEGATIVE

## 2019-07-09 ENCOUNTER — Ambulatory Visit (INDEPENDENT_AMBULATORY_CARE_PROVIDER_SITE_OTHER): Payer: BLUE CROSS/BLUE SHIELD | Admitting: Obstetrics and Gynecology

## 2019-07-09 ENCOUNTER — Ambulatory Visit (INDEPENDENT_AMBULATORY_CARE_PROVIDER_SITE_OTHER): Payer: BLUE CROSS/BLUE SHIELD

## 2019-07-09 ENCOUNTER — Other Ambulatory Visit: Payer: Self-pay

## 2019-07-09 ENCOUNTER — Encounter: Payer: Self-pay | Admitting: Obstetrics and Gynecology

## 2019-07-09 ENCOUNTER — Other Ambulatory Visit: Payer: Self-pay | Admitting: Obstetrics and Gynecology

## 2019-07-09 ENCOUNTER — Other Ambulatory Visit: Payer: Self-pay | Admitting: Internal Medicine

## 2019-07-09 VITALS — BP 148/68 | HR 91 | Wt 260.0 lb

## 2019-07-09 DIAGNOSIS — Z3491 Encounter for supervision of normal pregnancy, unspecified, first trimester: Secondary | ICD-10-CM

## 2019-07-09 DIAGNOSIS — N8312 Corpus luteum cyst of left ovary: Secondary | ICD-10-CM | POA: Diagnosis not present

## 2019-07-09 DIAGNOSIS — Z1389 Encounter for screening for other disorder: Secondary | ICD-10-CM

## 2019-07-09 DIAGNOSIS — Z3A01 Less than 8 weeks gestation of pregnancy: Secondary | ICD-10-CM

## 2019-07-09 DIAGNOSIS — O3481 Maternal care for other abnormalities of pelvic organs, first trimester: Secondary | ICD-10-CM | POA: Diagnosis not present

## 2019-07-09 LAB — POCT URINALYSIS DIPSTICK OB
Bilirubin, UA: NEGATIVE
Blood, UA: NEGATIVE
Glucose, UA: NEGATIVE
Ketones, UA: NEGATIVE
Leukocytes, UA: NEGATIVE
Nitrite, UA: NEGATIVE
Spec Grav, UA: 1.01 (ref 1.010–1.025)
Urobilinogen, UA: 0.2 E.U./dL
pH, UA: 6.5 (ref 5.0–8.0)

## 2019-07-09 NOTE — Progress Notes (Signed)
HPI:      [name] is a 33 y.o. G4P3 who LMP was Patient's last menstrual period was 05/14/2019 (approximate).  Subjective:   She presents today after being seen in the emergency department for pelvic cramping.  She was found to be pregnant.  Patient is unsure of her last menstrual period. She had an ultrasound here today for dating and fetal viability.    Hx: The following portions of the patient's history were reviewed and updated as appropriate:             She  has a past medical history of Depression and PCOS (polycystic ovarian syndrome). She does not have any pertinent problems on file. She  has a past surgical history that includes No past surgeries. Her family history includes Lung cancer in her maternal grandmother; Uterine cancer (age of onset: 49) in her maternal grandfather. She  reports that she has been smoking cigarettes. She has been smoking about 0.00 packs per day. She has never used smokeless tobacco. She reports current alcohol use. She reports that she does not use drugs. She currently has no medications in their medication list. She has No Known Allergies.       Review of Systems:  Review of Systems  Constitutional: Denied constitutional symptoms, night sweats, recent illness, fatigue, fever, insomnia and weight loss.  Eyes: Denied eye symptoms, eye pain, photophobia, vision change and visual disturbance.  Ears/Nose/Throat/Neck: Denied ear, nose, throat or neck symptoms, hearing loss, nasal discharge, sinus congestion and sore throat.  Cardiovascular: Denied cardiovascular symptoms, arrhythmia, chest pain/pressure, edema, exercise intolerance, orthopnea and palpitations.  Respiratory: Denied pulmonary symptoms, asthma, pleuritic pain, productive sputum, cough, dyspnea and wheezing.  Gastrointestinal: Denied, gastro-esophageal reflux, melena, nausea and vomiting.  Genitourinary: Denied genitourinary symptoms including symptomatic vaginal discharge, pelvic  relaxation issues, and urinary complaints.  Musculoskeletal: Denied musculoskeletal symptoms, stiffness, swelling, muscle weakness and myalgia.  Dermatologic: Denied dermatology symptoms, rash and scar.  Neurologic: Denied neurology symptoms, dizziness, headache, neck pain and syncope.  Psychiatric: Denied psychiatric symptoms, anxiety and depression.  Endocrine: Denied endocrine symptoms including hot flashes and night sweats.   Meds:   No current outpatient medications on file prior to visit.   No current facility-administered medications on file prior to visit.     Objective:     Vitals:   07/09/19 1036  BP: (!) 148/68  Pulse: 91              Ultrasound today reveals 6-week 2-day intrauterine pregnancy.  Fetal heart tones 90 bpm.  Assessment:    G4P3 Patient Active Problem List   Diagnosis Date Noted  . 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 Depression, major, single episode, moderate (HCC) 02/24/2016  . Generalized anxiety disorder 02/24/2016  . Involuntary commitment 02/24/2016     1. Prenatal care in first trimester     ED follow-up.  Patient not having any problems at this time.   Plan:            Prenatal Plan 1.  The patient was given prenatal literature. 2.  She was continued on prenatal vitamins. 3.  A prenatal lab panel was ordered or drawn. 4.  An ultrasound was ordered to better determine an EDC. 5.  A nurse visit was scheduled. 6.  Genetic testing and testing for other inheritable conditions discussed in detail. She will decide in the future whether to have these labs performed. 7.  A general overview of pregnancy testing, visit schedule, ultrasound schedule, and prenatal  care was discussed. 8.  Benefits of breast-feeding discussed in detail including both maternal and infant benefits. Ready Set Baby website discussed. 9.  Recommend early 1 hour GCT.  The following were addressed during this visit:  Breastfeeding Education - Early initiation of  breastfeeding    Comments: Keeps milk supply adequate, helps contract uterus and slow bleeding, and early milk is the perfect first food and is easy to digest.   - The importance of exclusive breastfeeding    Comments: Provides antibodies, Lower risk of breast and ovarian cancers, and type-2 diabetes,Helps your body recover, Reduced chance of SIDS.   - Exclusive breastfeeding for the first 6 months    Comments: Builds a healthy milk supply and keeps it up, protects baby from sickness and disease, and breastmilk has everything your baby needs for the first 6 months.  - Individualized Education    Comments: Contraindications to breastfeeding and other special medical conditions     Orders Orders Placed This Encounter  Procedures  . POC Urinalysis Dipstick OB    No orders of the defined types were placed in this encounter.     F/U  Return in about 3 weeks (around 07/30/2019). I spent 32 minutes involved in the care of this patient of which greater than 50% was spent discussing prenatal care, breast-feeding, coordination of care for emergency department, review of ultrasound.  All questions answered  Finis Bud, M.D. 07/09/2019 11:19 AM

## 2019-07-09 NOTE — Progress Notes (Signed)
Pt's visit today due to ED follow up. Pt stated going to ED due to having cramping while pregnant.

## 2019-07-24 ENCOUNTER — Encounter: Payer: Self-pay | Admitting: Emergency Medicine

## 2019-07-24 ENCOUNTER — Other Ambulatory Visit: Payer: Self-pay

## 2019-07-24 ENCOUNTER — Emergency Department: Payer: BLUE CROSS/BLUE SHIELD

## 2019-07-24 ENCOUNTER — Emergency Department
Admission: EM | Admit: 2019-07-24 | Discharge: 2019-07-24 | Disposition: A | Discharge status: Home / Self Care | Payer: BLUE CROSS/BLUE SHIELD | Attending: Student | Admitting: Student

## 2019-07-24 DIAGNOSIS — Z3A01 Less than 8 weeks gestation of pregnancy: Secondary | ICD-10-CM | POA: Insufficient documentation

## 2019-07-24 DIAGNOSIS — O99331 Smoking (tobacco) complicating pregnancy, first trimester: Secondary | ICD-10-CM | POA: Diagnosis not present

## 2019-07-24 DIAGNOSIS — R102 Pelvic and perineal pain: Secondary | ICD-10-CM | POA: Diagnosis not present

## 2019-07-24 DIAGNOSIS — O26891 Other specified pregnancy related conditions, first trimester: Secondary | ICD-10-CM | POA: Diagnosis not present

## 2019-07-24 DIAGNOSIS — F17210 Nicotine dependence, cigarettes, uncomplicated: Secondary | ICD-10-CM | POA: Insufficient documentation

## 2019-07-24 DIAGNOSIS — O23591 Infection of other part of genital tract in pregnancy, first trimester: Secondary | ICD-10-CM | POA: Diagnosis not present

## 2019-07-24 DIAGNOSIS — O26899 Other specified pregnancy related conditions, unspecified trimester: Secondary | ICD-10-CM

## 2019-07-24 DIAGNOSIS — B379 Candidiasis, unspecified: Secondary | ICD-10-CM

## 2019-07-24 DIAGNOSIS — O039 Complete or unspecified spontaneous abortion without complication: Secondary | ICD-10-CM

## 2019-07-24 LAB — URINALYSIS, COMPLETE (UACMP) WITH MICROSCOPIC
Bacteria, UA: NONE SEEN
Bilirubin Urine: NEGATIVE
Glucose, UA: NEGATIVE mg/dL
Hgb urine dipstick: NEGATIVE
Ketones, ur: NEGATIVE mg/dL
Leukocytes,Ua: NEGATIVE
Nitrite: NEGATIVE
Protein, ur: NEGATIVE mg/dL
Specific Gravity, Urine: 1.018 (ref 1.005–1.030)
pH: 6 (ref 5.0–8.0)

## 2019-07-24 LAB — COMPREHENSIVE METABOLIC PANEL
ALT: 14 U/L (ref 0–44)
AST: 13 U/L — ABNORMAL LOW (ref 15–41)
Albumin: 3.4 g/dL — ABNORMAL LOW (ref 3.5–5.0)
Alkaline Phosphatase: 51 U/L (ref 38–126)
Anion gap: 8 (ref 5–15)
BUN: 10 mg/dL (ref 6–20)
CO2: 21 mmol/L — ABNORMAL LOW (ref 22–32)
Calcium: 8.6 mg/dL — ABNORMAL LOW (ref 8.9–10.3)
Chloride: 106 mmol/L (ref 98–111)
Creatinine, Ser: 0.56 mg/dL (ref 0.44–1.00)
GFR calc Af Amer: >60 mL/min (ref >60)
GFR calc non Af Amer: >60 mL/min (ref >60)
Glucose, Bld: 101 mg/dL — ABNORMAL HIGH (ref 70–99)
Potassium: 3.8 mmol/L (ref 3.5–5.1)
Sodium: 135 mmol/L (ref 135–145)
Total Bilirubin: 0.4 mg/dL (ref 0.3–1.2)
Total Protein: 7.7 g/dL (ref 6.5–8.1)

## 2019-07-24 LAB — WET PREP, GENITAL
Clue Cells Wet Prep HPF POC: NONE SEEN
Sperm: NONE SEEN
Trich, Wet Prep: NONE SEEN

## 2019-07-24 LAB — CBC
HCT: 38.9 % (ref 36.0–46.0)
Hemoglobin: 13.3 g/dL (ref 12.0–15.0)
MCH: 29.7 pg (ref 26.0–34.0)
MCHC: 34.2 g/dL (ref 30.0–36.0)
MCV: 86.8 fL (ref 80.0–100.0)
Platelets: 237 10*3/uL (ref 150–400)
RBC: 4.48 MIL/uL (ref 3.87–5.11)
RDW: 13.6 % (ref 11.5–15.5)
WBC: 10.9 10*3/uL — ABNORMAL HIGH (ref 4.0–10.5)
nRBC: 0 % (ref 0.0–0.2)

## 2019-07-24 LAB — TYPE AND SCREEN
ABO/RH(D): A POS
Antibody Screen: NEGATIVE

## 2019-07-24 LAB — HCG, QUANTITATIVE, PREGNANCY: hCG, Beta Chain, Quant, S: 33813 m[IU]/mL — ABNORMAL HIGH (ref <5)

## 2019-07-24 LAB — LIPASE, BLOOD: Lipase: 31 U/L (ref 11–51)

## 2019-07-24 MED ORDER — SODIUM CHLORIDE 0.9% FLUSH: 3.0000 mL | Freq: Once | INTRAVENOUS | Status: DC

## 2019-07-24 MED ORDER — CLOTRIMAZOLE 1 % VA CREA: 1.0000 | TOPICAL_CREAM | Freq: Every day | VAGINAL | 0 refills | Status: AC

## 2019-07-24 NOTE — Discharge Instructions (Addendum)
Thank you for letting us take care of you in the emergency department today. Your swabs were positive for yeast infection, we will start you on medication for this. Unfortunately, it also appears that you may be having a miscarriage. It is important to know that this is NOT your fault. We have scheduled you an appointment with your OB/GYN doctor for tomorrow at 8 AM to discuss these results.  New medications we have prescribed:  - Clotrimazole cream - apply vaginally for 7 days  Please follow up with: - Your OBGYN doctor to review your ER visit and follow up on your symptoms.   Please return to the ER for any new or worsening symptoms.

## 2019-07-24 NOTE — ED Notes (Signed)
Sent pink tube to lab.

## 2019-07-24 NOTE — ED Notes (Signed)
Type and screen recollected and sent to lab. 

## 2019-07-24 NOTE — ED Triage Notes (Signed)
Pt presents to ED via POV with c/o lower abdominal cramping. Pt states is G4P3L3, pt states cramping started at approx 0400 today. Pt denies vaginal bleeding at this time.

## 2019-07-24 NOTE — ED Provider Notes (Signed)
[facility] Emergency Department Provider Note  ____________________________________________   First MD Initiated Contact with Patient 07/24/19 1004     (approximate)  I have reviewed the triage vital signs and the nursing notes.  History  Chief Complaint Abdominal Pain    HPI Kimberly Arroyo is a 33 y.o. female [phone], approximate LMP 9/30, however she has a history of PCOS and does not have regular periods.  She presents today for lower abdominal/pelvic cramping.  Symptoms started early this morning.  Pain is located in her lower abdomen, pelvic area.  Cramping in description.  8/10 in severity.  No radiation.  No alleviating/aggravating factors.  She denies any associated dysuria, vaginal bleeding, vaginal discharge, or itching.  She denies any concern for STD exposure.   Past Medical Hx Past Medical History:  Diagnosis Date  . Depression   . PCOS (polycystic ovarian syndrome)     Problem List Patient Active Problem List   Diagnosis Date Noted  . 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 Depression, major, single episode, moderate ([place]) 02/24/2016  . Generalized anxiety disorder 02/24/2016  . Involuntary commitment 02/24/2016    Past Surgical Hx Past Surgical History:  Procedure Laterality Date  . NO PAST SURGERIES      Medications Prior to Admission medications   Not on File    Allergies Patient has no known allergies.  Family Hx Family History  Problem Relation Age of Onset  . Lung cancer Maternal Grandmother   . Uterine cancer Maternal Grandfather 70       pt has contact    Social Hx Social History   Tobacco Use  . Smoking status: Current Some Day Smoker    Packs/day: 0.00    Types: Cigarettes    Last attempt to quit: 09/25/2016    Years since quitting: 2.8  . Smokeless tobacco: Never Used  Substance Use Topics  . Alcohol use: Yes    Comment: occ  . Drug use: No     Review of Systems  Constitutional: Negative for  fever, chills. Eyes: Negative for visual changes. ENT: Negative for sore throat. Cardiovascular: Negative for chest pain. Respiratory: Negative for shortness of breath. Gastrointestinal: Negative for nausea, vomiting.  Genitourinary:+ lower abdominal/pelvic cramping Musculoskeletal: Negative for leg swelling. Skin: Negative for rash. Neurological: Negative for for headaches.   Physical Exam  Vital Signs: ED Triage Vitals  Enc Vitals Group     BP 07/24/19 0848 (!) 142/70     Pulse Rate 07/24/19 0848 78     Resp 07/24/19 0848 18     Temp 07/24/19 0848 98.1 F (36.7 C)     Temp Source 07/24/19 0848 Oral     SpO2 07/24/19 0848 100 %     Weight 07/24/19 0832 260 lb (117.9 kg)     Height 07/24/19 0832 5\' 5"  (1.651 m)     Head Circumference --      Peak Flow --      Pain Score 07/24/19 0832 8     Pain Loc --      Pain Edu? --      Excl. in [place]? --     Constitutional: Alert and oriented.  Head: Normocephalic. Atraumatic. Eyes: Conjunctivae clear. Sclera anicteric. Nose: No congestion. No rhinorrhea. Mouth/Throat: Wearing mask.  Neck: No stridor.   Cardiovascular: Normal rate. Extremities well perfused. Respiratory: Normal respiratory effort.   Gastrointestinal: Soft. Lower suprapubic discomfort with palpation, no rebound or guarding. Pelvic: RN chaperone present.  Normal-appearing external genitalia.  Small amount of thin, white discharge in the vaginal canal.  No bleeding.  No erythema or irritation of the cervix.  No CMT or adnexal tenderness. Cervix closed.  Musculoskeletal: No lower extremity edema. No deformities. Neurologic:  Normal speech and language. No gross focal neurologic deficits are appreciated.  Skin: Skin is warm, dry and intact. No rash noted. Psychiatric: Mood and affect are appropriate for situation.  EKG  N/A   Radiology  [place]: IMPRESSION:  1. IUP with estimated gestational age [redacted] weeks 0 days. No cardiac activity is demonstrated. No significant  interval growth compared to previous exam 07/09/2019 at which time cardiac activity was observed. Findings indicate failed pregnancy. Recommend correlation with previous quantitative HCG and follow-up as clinically indicated.     Procedures  Procedure(s) performed (including critical care):  Procedures   Initial Impression / Assessment and Plan / ED Course  34 y.o. female [phone], who presents to the ED for pelvic cramping.  Ddx: UTI, pelvic infection, miscarriage  Wet prep is positive for yeast.  We will plan to treat with intravaginal clotrimazole x 7 days.  UA negative for infection.  [place] concerning for failed pregnancy. No cardiac activity visualized, which was seen on prior ultrasound.  Updated patient on these results, who was appropriately tearful.  Discussed with [facility], who will see her in the clinic tomorrow at 8 AM.  We will plan for expectant management until then.  Patient voices understanding is comfortable with the plan and discharge at this time.   Final Clinical Impression(s) / ED Diagnosis  Final diagnoses:  Pelvic cramping  Candidiasis  Miscarriage       Note:  This document was prepared using Dragon voice recognition software and may include unintentional dictation errors.   Miguel Aschoff., MD 07/24/19 [phone]

## 2019-07-24 NOTE — ED Notes (Signed)
See triage note  Presents with some abd cramping  States she is approx [redacted] weeks pregnant  Denies any vaginal bleeding

## 2019-07-24 NOTE — ED Notes (Signed)
Attempt x2 for blood draw with no success

## 2019-07-25 ENCOUNTER — Encounter: Payer: Self-pay | Admitting: Obstetrics and Gynecology

## 2019-07-25 ENCOUNTER — Other Ambulatory Visit: Payer: Self-pay

## 2019-07-25 ENCOUNTER — Ambulatory Visit: Payer: BLUE CROSS/BLUE SHIELD | Admitting: Obstetrics and Gynecology

## 2019-07-25 VITALS — BP 109/66 | HR 82 | Ht 65.0 in | Wt 255.3 lb

## 2019-07-25 DIAGNOSIS — O021 Missed abortion: Secondary | ICD-10-CM | POA: Diagnosis not present

## 2019-07-25 NOTE — Progress Notes (Signed)
HPI:      [name] is a 33 y.o. G4P3 who LMP was Patient's last menstrual period was 05/14/2019 (approximate).  Subjective:   She presents today after being seen in the emergency department for pelvic cramping.  An ultrasound was performed revealing an IUP without fetal heart tones.  Patient previously had fetal heart tones noted on ultrasound. At this time she is experiencing no bleeding or cramping. Of significant note she has had several previous successful pregnancies.    Hx: The following portions of the patient's history were reviewed and updated as appropriate:             She  has a past medical history of Depression and PCOS (polycystic ovarian syndrome). She does not have any pertinent problems on file. She  has a past surgical history that includes No past surgeries. Her family history includes Lung cancer in her maternal grandmother; Uterine cancer (age of onset: 14) in her maternal grandfather. She  reports that she has been smoking cigarettes. She has been smoking about 0.00 packs per day. She has never used smokeless tobacco. She reports current alcohol use. She reports that she does not use drugs. She has a current medication list which includes the following prescription(s): clotrimazole. She has No Known Allergies.       Review of Systems:  Review of Systems  Constitutional: Denied constitutional symptoms, night sweats, recent illness, fatigue, fever, insomnia and weight loss.  Eyes: Denied eye symptoms, eye pain, photophobia, vision change and visual disturbance.  Ears/Nose/Throat/Neck: Denied ear, nose, throat or neck symptoms, hearing loss, nasal discharge, sinus congestion and sore throat.  Cardiovascular: Denied cardiovascular symptoms, arrhythmia, chest pain/pressure, edema, exercise intolerance, orthopnea and palpitations.  Respiratory: Denied pulmonary symptoms, asthma, pleuritic pain, productive sputum, cough, dyspnea and wheezing.  Gastrointestinal:  Denied, gastro-esophageal reflux, melena, nausea and vomiting.  Genitourinary: Denied genitourinary symptoms including symptomatic vaginal discharge, pelvic relaxation issues, and urinary complaints.  Musculoskeletal: Denied musculoskeletal symptoms, stiffness, swelling, muscle weakness and myalgia.  Dermatologic: Denied dermatology symptoms, rash and scar.  Neurologic: Denied neurology symptoms, dizziness, headache, neck pain and syncope.  Psychiatric: Denied psychiatric symptoms, anxiety and depression.  Endocrine: Denied endocrine symptoms including hot flashes and night sweats.   Meds:   Current Outpatient Medications on File Prior to Visit  Medication Sig Dispense Refill  . clotrimazole (GYNE-LOTRIMIN) 1 % vaginal cream Place 1 Applicatorful vaginally daily for 7 days. 45 g 0   No current facility-administered medications on file prior to visit.    Objective:     Vitals:   07/25/19 0809  BP: 109/66  Pulse: 82              Ultrasound results reviewed directly with the patient  Assessment:    G4P3 Patient Active Problem List   Diagnosis Date Noted  . 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 Depression, major, single episode, moderate ([place]) 02/24/2016  . Generalized anxiety disorder 02/24/2016  . Involuntary commitment 02/24/2016     1. Missed abortion     I have explained this to her in detail including the presence of a heartbeat and now the absence of a heartbeat.  We have discussed failed pregnancy.   Plan:            1.  I have given her multiple options including expectant management for a limited time, medical management, and D&E.  The risks and benefits of each were discussed in detail.  Complete versus incomplete miscarriage discussed in detail.  All of her questions were answered. At this time she has chosen to employ expectant management for the next 10 days and see if she "has a miscarriage on her own at home."  If this does not occur in the next 10 days we  will discuss it again and other options will be employed. Orders No orders of the defined types were placed in this encounter.   No orders of the defined types were placed in this encounter.     F/U  Return in about 10 days (around 08/04/2019). I spent 19 minutes involved in the care of this patient of which greater than 50% was spent discussing missed AB, surgical versus medical options, risks and benefits, see discussion above.  All questions answered.  Elonda Husky, M.D. 07/25/2019 8:31 AM

## 2019-07-25 NOTE — Progress Notes (Signed)
Patient comes in today for ED follow up. Patient stated that ED told her she may be having a miscarriage. She denies vaginal bleeding. She had cramping. No longer having cramping.

## 2019-08-12 ENCOUNTER — Ambulatory Visit: Payer: BLUE CROSS/BLUE SHIELD | Admitting: Obstetrics and Gynecology

## 2019-08-21 ENCOUNTER — Encounter: Payer: BLUE CROSS/BLUE SHIELD | Admitting: Obstetrics and Gynecology

## 2019-09-23 ENCOUNTER — Ambulatory Visit: Payer: Self-pay | Admitting: Physician Assistant

## 2019-09-23 ENCOUNTER — Other Ambulatory Visit: Payer: Self-pay

## 2019-09-23 DIAGNOSIS — Z113 Encounter for screening for infections with a predominantly sexual mode of transmission: Secondary | ICD-10-CM

## 2019-09-23 LAB — WET PREP FOR TRICH, YEAST, CLUE
Trichomonas Exam: NEGATIVE
Yeast Exam: NEGATIVE

## 2019-09-23 NOTE — Progress Notes (Signed)
Wet prep reviewed-no Tx indicated Kaston Faughn, RN  

## 2019-09-24 ENCOUNTER — Encounter: Payer: Self-pay | Admitting: Physician Assistant

## 2019-09-24 NOTE — Progress Notes (Signed)
[facility] Department STI clinic/screening visit  Subjective:  Kimberly Arroyo is a 34 y.o. female being seen today for an STI screening visit. The patient reports they do not have symptoms.  Patient reports that they do not desire a pregnancy in the next year.   They reported they are not interested in discussing contraception today.  Patient's last menstrual period was 05/14/2019 (approximate).   Patient has the following medical conditions:   Patient Active Problem List   Diagnosis Date Noted  . 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 Depression, major, single episode, moderate (HCC) 02/24/2016  . Generalized anxiety disorder 02/24/2016  . Involuntary commitment 02/24/2016    Chief Complaint  Patient presents with  . SEXUALLY TRANSMITTED DISEASE    Screening     HPI  Patient reports that she is not having any symptoms but a partner asked her to get a screening.  States that she has a history of PCOS and irregular periods.  Reports that she had a miscarriage at about [redacted] wk EGA in December and has had irregular bleeding since.  States that she has a follow up appt with her PCP/Gyn in about 1 week.  See flowsheet for further details and programmatic requirements.    The following portions of the patient's history were reviewed and updated as appropriate: allergies, current medications, past medical history, past social history, past surgical history and problem list.  Objective:  There were no vitals filed for this visit.  Physical Exam Constitutional:      General: She is not in acute distress.    Appearance: Normal appearance.  HENT:     Head: Normocephalic and atraumatic.     Comments: No nits, lice, or hair loss. No cervical, supraclavicular or axillary adenopathy.    Mouth/Throat:     Mouth: Mucous membranes are moist.     Pharynx: Oropharynx is clear. No oropharyngeal exudate or posterior oropharyngeal erythema.  Eyes:     Conjunctiva/sclera: Conjunctivae  normal.  Pulmonary:     Effort: Pulmonary effort is normal.  Abdominal:     Palpations: Abdomen is soft. There is no mass.     Tenderness: There is no abdominal tenderness. There is no guarding or rebound.  Genitourinary:    General: Normal vulva.     Rectum: Normal.     Comments: External genitalia/pubic area without nits, lice, edema, erythema, lesions and inguinal adenopathy. Vagina with normal mucosa, small amount of dark menstrual bleeding present. Cervix without visible lesions. Uterus firm, mobile, nt, no CMT, no masses, no adnexal tenderness or fullness. Musculoskeletal:     Cervical back: Neck supple. No tenderness.  Skin:    General: Skin is warm and dry.     Findings: No bruising, erythema, lesion or rash.  Neurological:     Mental Status: She is alert and oriented to person, place, and time.  Psychiatric:        Mood and Affect: Mood normal.        Behavior: Behavior normal.        Thought Content: Thought content normal.        Judgment: Judgment normal.      Assessment and Plan:  Carmelite Violet is a 34 y.o. female presenting to the [facility] Department for STI screening  1. Screening for STD (sexually transmitted disease) Patient into clinic without symptoms. Rec condoms with all sex. Await test results.  Counseled that RN will call if needs to RTC for treatment once results are back. Enc  to keep follow up appt with PCP/Gyn as scheduled. - WET PREP FOR TRICH, YEAST, CLUE - Chlamydia/Gonorrhea [place] [facility] - HIV/HCV [facility] - Syphilis Serology, [facility]     No follow-ups on file.  No future appointments.  Jerene Dilling, PA

## 2019-09-30 LAB — HM HEPATITIS C SCREENING LAB: HM Hepatitis Screen: NEGATIVE

## 2019-09-30 LAB — HM HIV SCREENING LAB: HM HIV Screening: NEGATIVE

## 2020-01-29 ENCOUNTER — Other Ambulatory Visit: Payer: Self-pay

## 2020-01-29 ENCOUNTER — Emergency Department
Admission: EM | Admit: 2020-01-29 | Discharge: 2020-01-29 | Disposition: A | Discharge status: Home / Self Care | Payer: BLUE CROSS/BLUE SHIELD | Attending: Emergency Medicine | Admitting: Emergency Medicine

## 2020-01-29 ENCOUNTER — Encounter: Payer: Self-pay | Admitting: Emergency Medicine

## 2020-01-29 DIAGNOSIS — Y92410 Unspecified street and highway as the place of occurrence of the external cause: Secondary | ICD-10-CM | POA: Diagnosis not present

## 2020-01-29 DIAGNOSIS — Y998 Other external cause status: Secondary | ICD-10-CM | POA: Diagnosis not present

## 2020-01-29 DIAGNOSIS — S3992XA Unspecified injury of lower back, initial encounter: Secondary | ICD-10-CM | POA: Diagnosis present

## 2020-01-29 DIAGNOSIS — F17210 Nicotine dependence, cigarettes, uncomplicated: Secondary | ICD-10-CM | POA: Insufficient documentation

## 2020-01-29 DIAGNOSIS — S39012A Strain of muscle, fascia and tendon of lower back, initial encounter: Secondary | ICD-10-CM | POA: Diagnosis not present

## 2020-01-29 DIAGNOSIS — Y9389 Activity, other specified: Secondary | ICD-10-CM | POA: Insufficient documentation

## 2020-01-29 MED ORDER — CYCLOBENZAPRINE HCL 5 MG PO TABS: 5.0000 mg | ORAL_TABLET | Freq: Three times a day (TID) | ORAL | 0 refills | Status: DC | PRN

## 2020-01-29 MED ORDER — NAPROXEN 500 MG PO TABS
500.0000 mg | ORAL_TABLET | Freq: Two times a day (BID) | ORAL | 0 refills | Status: DC
Start: 2020-01-29 — End: 2022-01-31
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
## 2020-01-29 NOTE — Discharge Instructions (Signed)
Rest and ice lower back.  Take Tylenol as needed for mild pain.  You may use Flexeril and naproxen as needed for moderate to severe pain.  Return to the ER for any increasing pain worsening symptoms or urgent changes in your health.

## 2020-05-22 ENCOUNTER — Emergency Department
Admission: EM | Admit: 2020-05-22 | Discharge: 2020-05-22 | Disposition: A | Discharge status: Home / Self Care | Payer: BLUE CROSS/BLUE SHIELD | Attending: Emergency Medicine | Admitting: Emergency Medicine

## 2020-05-22 ENCOUNTER — Encounter: Payer: Self-pay | Admitting: Emergency Medicine

## 2020-05-22 DIAGNOSIS — F17210 Nicotine dependence, cigarettes, uncomplicated: Secondary | ICD-10-CM | POA: Insufficient documentation

## 2020-05-22 DIAGNOSIS — H60502 Unspecified acute noninfective otitis externa, left ear: Secondary | ICD-10-CM

## 2020-05-22 DIAGNOSIS — H9202 Otalgia, left ear: Secondary | ICD-10-CM

## 2020-05-22 DIAGNOSIS — H6092 Unspecified otitis externa, left ear: Secondary | ICD-10-CM | POA: Insufficient documentation

## 2020-05-22 MED ORDER — LIDOCAINE HCL (PF) 1 % IJ SOLN
2.0000 mL | Freq: Once | INTRAMUSCULAR | Status: AC
  Administered 2020-05-22: 2 mL
  Filled 2020-05-22: qty 5

## 2020-05-22 MED ORDER — CIPROFLOXACIN-DEXAMETHASONE 0.3-0.1 % OT SUSP
4.0000 [drp] | Freq: Once | OTIC | Status: AC
  Administered 2020-05-22: 4 [drp] via OTIC
  Filled 2020-05-22: qty 7.5

## 2020-05-22 MED ORDER — CIPROFLOXACIN-DEXAMETHASONE 0.3-0.1 % OT SUSP
4.0000 [drp] | Freq: Two times a day (BID) | OTIC | 0 refills | Status: AC
Start: 2020-05-22 — End: 2020-05-29

## 2020-05-22 MED ORDER — OXYCODONE-ACETAMINOPHEN 5-325 MG PO TABS: 1.0000 | ORAL_TABLET | ORAL | 0 refills | Status: DC | PRN

## 2020-05-22 NOTE — ED Triage Notes (Signed)
Pt c/o left sided ear pain x2 days with no discharge and pt denies fever.
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
## 2020-05-22 NOTE — Discharge Instructions (Signed)
1.  Apply antibiotic eardrops - 4 drops to left ear twice daily x7 days. 2.  You may take Ibuprofen as needed for pain; Percocet (#15) as needed for more severe pain. 3.  Return to the ER for worsening symptoms, persistent vomiting, difficulty breathing or other concerns.

## 2020-08-08 ENCOUNTER — Other Ambulatory Visit: Payer: Self-pay

## 2020-08-08 DIAGNOSIS — R1110 Vomiting, unspecified: Secondary | ICD-10-CM | POA: Diagnosis present

## 2020-08-08 DIAGNOSIS — U071 COVID-19: Secondary | ICD-10-CM | POA: Insufficient documentation

## 2020-08-08 DIAGNOSIS — F17210 Nicotine dependence, cigarettes, uncomplicated: Secondary | ICD-10-CM | POA: Insufficient documentation

## 2020-08-09 ENCOUNTER — Other Ambulatory Visit: Payer: Self-pay

## 2020-08-09 ENCOUNTER — Emergency Department
Admission: EM | Admit: 2020-08-09 | Discharge: 2020-08-09 | Disposition: A | Discharge status: Home / Self Care | Payer: HRSA Program | Attending: Emergency Medicine | Admitting: Emergency Medicine

## 2020-08-09 ENCOUNTER — Encounter: Payer: Self-pay | Admitting: Emergency Medicine

## 2020-08-09 DIAGNOSIS — U071 COVID-19: Secondary | ICD-10-CM

## 2020-08-09 LAB — COMPREHENSIVE METABOLIC PANEL
ALT: 18 U/L (ref 0–44)
AST: 18 U/L (ref 15–41)
Albumin: 3.7 g/dL (ref 3.5–5.0)
Alkaline Phosphatase: 49 U/L (ref 38–126)
Anion gap: 7 (ref 5–15)
BUN: 15 mg/dL (ref 6–20)
CO2: 25 mmol/L (ref 22–32)
Calcium: 8.4 mg/dL — ABNORMAL LOW (ref 8.9–10.3)
Chloride: 105 mmol/L (ref 98–111)
Creatinine, Ser: 0.66 mg/dL (ref 0.44–1.00)
GFR, Estimated: >60 mL/min (ref >60)
Glucose, Bld: 100 mg/dL — ABNORMAL HIGH (ref 70–99)
Potassium: 3.7 mmol/L (ref 3.5–5.1)
Sodium: 137 mmol/L (ref 135–145)
Total Bilirubin: 0.8 mg/dL (ref 0.3–1.2)
Total Protein: 7.2 g/dL (ref 6.5–8.1)

## 2020-08-09 LAB — CBC
HCT: 43.3 % (ref 36.0–46.0)
Hemoglobin: 14.6 g/dL (ref 12.0–15.0)
MCH: 29.7 pg (ref 26.0–34.0)
MCHC: 33.7 g/dL (ref 30.0–36.0)
MCV: 88.2 fL (ref 80.0–100.0)
Platelets: 183 10*3/uL (ref 150–400)
RBC: 4.91 MIL/uL (ref 3.87–5.11)
RDW: 13.4 % (ref 11.5–15.5)
WBC: 6.3 10*3/uL (ref 4.0–10.5)
nRBC: 0 % (ref 0.0–0.2)

## 2020-08-09 LAB — RESP PANEL BY RT-PCR (FLU A&B, COVID) ARPGX2
Influenza A by PCR: NEGATIVE
Influenza B by PCR: NEGATIVE
SARS Coronavirus 2 by RT PCR: POSITIVE — AB

## 2020-08-09 LAB — LIPASE, BLOOD: Lipase: 31 U/L (ref 11–51)

## 2020-08-09 MED ORDER — ONDANSETRON 4 MG PO TBDP
4.0000 mg | ORAL_TABLET | Freq: Three times a day (TID) | ORAL | 0 refills | Status: DC | PRN
Start: 2020-08-09 — End: 2022-04-01

## 2020-08-09 MED ORDER — ONDANSETRON 4 MG PO TBDP: 4.0000 mg | ORAL_TABLET | Freq: Once | ORAL | Status: DC | PRN

## 2020-08-09 NOTE — Discharge Instructions (Signed)
Please return for increasing fever, shortness of breath, being unable to keep down fluids or feeling worse.  I have contacted the antibody infusion clinic they should be calling you this morning or tomorrow.  That would probably be the best treatment for you at this point.  Remember to come back if you get worse.  If you get a lot worse we will put you in the hospital but currently you should be safe at home.  Have given you a prescription for Zofran melt on your tongue wafers you can use 1 3 times a day for nausea.  Make sure you are getting enough liquids to drink.  For tonight and tomorrow morning drink small amounts of fluid frequently.  I would use clear liquids until lunchtime.  After lunch you can drink whenever you want and eat what ever you want but also small amounts frequently.  Too much at once might make you vomit again.

## 2020-08-09 NOTE — ED Provider Notes (Signed)
[facility] Emergency Department Provider Note   ____________________________________________   Event Date/Time   First MD Initiated Contact with Patient 08/09/20 [phone]     (approximate)  I have reviewed the triage vital signs and the nursing notes.   HISTORY  Chief Complaint Covid Exposure and Vomiting    HPI Kimberly Arroyo is a 34 y.o. female who reports she has had some shaking chills and occasional vomiting.  She is able to keep down fluids.  She was exposed to Covid.  Past medical history as below.         Past Medical History:  Diagnosis Date  . Depression   . PCOS (polycystic ovarian syndrome)     Patient Active Problem List   Diagnosis Date Noted  . 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 Depression, major, single episode, moderate ([facility]) 02/24/2016  . Generalized anxiety disorder 02/24/2016  . Involuntary commitment 02/24/2016    Past Surgical History:  Procedure Laterality Date  . NO PAST SURGERIES      Prior to Admission medications   Medication Sig Start Date End Date Taking? Authorizing Provider  cyclobenzaprine (FLEXERIL) 5 MG tablet Take 1-2 tablets (5-10 mg total) by mouth 3 (three) times daily as needed for muscle spasms. 01/29/20   Evon Slack, PA-C  naproxen (NAPROSYN) 500 MG tablet Take 1 tablet (500 mg total) by mouth 2 (two) times daily with a meal. 01/29/20   Evon Slack, PA-C  ondansetron (ZOFRAN ODT) 4 MG disintegrating tablet Take 1 tablet (4 mg total) by mouth every 8 (eight) hours as needed for nausea or vomiting. 08/09/20   Arnaldo Natal, MD  oxyCODONE-acetaminophen (PERCOCET/ROXICET) 5-325 MG tablet Take 1 tablet by mouth every 4 (four) hours as needed for severe pain. 05/22/20   Irean Hong, MD    Allergies Patient has no known allergies.  Family History  Problem Relation Age of Onset  . Lung cancer Maternal Grandmother   . Uterine cancer Maternal Grandfather 5       pt has contact    Social  History Social History   Tobacco Use  . Smoking status: Current Some Day Smoker    Packs/day: 0.00    Types: Cigarettes    Last attempt to quit: 09/25/2016    Years since quitting: 3.8  . Smokeless tobacco: Never Used  Vaping Use  . Vaping Use: Never used  Substance Use Topics  . Alcohol use: Yes    Comment: occ  . Drug use: No    Review of Systems  Constitutional currently no fever/chills patient has had subjective fever and chills today. Eyes: No visual changes. ENT: No sore throat. Cardiovascular: Denies chest pain. Respiratory: Denies shortness of breath. Gastrointestinal: Currently no abdominal pain.  No nausea, no vomiting.  No diarrhea.  No constipation.  Patient has had nausea and vomiting today. Genitourinary: Negative for dysuria. Musculoskeletal: Negative for back pain. Skin: Negative for rash. Neurological: Negative for headaches, focal weakness  ____________________________________________   PHYSICAL EXAM:  VITAL SIGNS: ED Triage Vitals  Enc Vitals Group     BP 08/09/20 0019 (!) 108/53     Pulse Rate 08/09/20 0019 80     Resp 08/09/20 0019 18     Temp 08/09/20 0019 98.9 F (37.2 C)     Temp Source 08/09/20 0019 Oral     SpO2 08/09/20 0019 99 %     Weight 08/09/20 0020 237 lb (107.5 kg)     Height 08/09/20 0020 5\' 5"  (  1.651 m)     Head Circumference --      Peak Flow --      Pain Score 08/09/20 0019 0     Pain Loc --      Pain Edu? --      Excl. in GC? --     Constitutional: Alert and oriented.  Looks somewhat ill but in no acute distress Eyes: Conjunctivae are normal.  Head: Atraumatic. Nose: No congestion/rhinnorhea. Mouth/Throat: Mucous membranes are moist.  Oropharynx non-erythematous. Neck: No stridor.  Cardiovascular: Normal rate, regular rhythm. Grossly normal heart sounds.  Good peripheral circulation. Respiratory: Normal respiratory effort.  No retractions. Lungs CTAB. Gastrointestinal: Soft and nontender. No distention. No abdominal  bruits. N Musculoskeletal: No lower extremity tenderness nor edema.   Neurologic:  Normal speech and language. No gross focal neurologic deficits are appreciated. Skin:  Skin is warm, dry and intact. No rash noted.   ____________________________________________   LABS (all labs ordered are listed, but only abnormal results are displayed)  Labs Reviewed  RESP PANEL BY RT-PCR (FLU A&B, COVID) ARPGX2 - Abnormal; Notable for the following components:      Result Value   SARS Coronavirus 2 by RT PCR POSITIVE (*)    All other components within normal limits  COMPREHENSIVE METABOLIC PANEL - Abnormal; Notable for the following components:   Glucose, Bld 100 (*)    Calcium 8.4 (*)    All other components within normal limits  LIPASE, BLOOD  CBC  URINALYSIS, COMPLETE (UACMP) WITH MICROSCOPIC  POC URINE PREG, ED   ____________________________________________  EKG   ____________________________________________  RADIOLOGY Jill Poling, personally viewed and evaluated these images (plain radiographs) as part of my medical decision making, as well as reviewing the written report by the radiologist.  ED MD interpretation:  Official radiology report(s): No results found.  ____________________________________________   PROCEDURES  Procedure(s) performed (including Critical Care):  Procedures   ____________________________________________   INITIAL IMPRESSION / ASSESSMENT AND PLAN / ED COURSE  Patient with Covid exposure and mild Covid-like illness with a positive Covid test.  I will give her some Zofran for the nausea.  She will return if she gets worse especially if she gets short of breath.              ____________________________________________   FINAL CLINICAL IMPRESSION(S) / ED DIAGNOSES  Final diagnoses:  COVID     ED Discharge Orders         Ordered    ondansetron (ZOFRAN ODT) 4 MG disintegrating tablet  Every 8 hours PRN        08/09/20  0253          *Please note:  Kimberly Arroyo was evaluated in Emergency Department on 08/09/2020 for the symptoms described in the history of present illness. She was evaluated in the context of the global COVID-19 pandemic, which necessitated consideration that the patient might be at risk for infection with the SARS-CoV-2 virus that causes COVID-19. Institutional protocols and algorithms that pertain to the evaluation of patients at risk for COVID-19 are in a state of rapid change based on information released by regulatory bodies including the CDC and federal and state organizations. These policies and algorithms were followed during the patient's care in the ED.  Some ED evaluations and interventions may be delayed as a result of limited staffing during and the pandemic.*   Note:  This document was prepared using Dragon voice recognition software and may include unintentional dictation errors.  Arnaldo Natal, MD 08/09/20 [phone]

## 2020-08-09 NOTE — ED Triage Notes (Addendum)
Pt arrived via POV with reports of covid exposure on 12/24, pt states she has been having chills, vomiting x4  and fatigue since yesterday.  No distress noted at this time.

## 2020-08-09 NOTE — ED Notes (Signed)
DC instructions discussed, pt asking about testing other members, gave her information for [facility] testing and advised to follow up with their PCPs re: testing and symptoms.  Encouraged patient to return to ED for worsening sxs and difficulty breathing as advised by Dr. Darnelle Catalan on DC paperwork.  Pt verbalized understanding. Pt ambulatory at discharge.  Also advised patient to have someone else pick up her RX if [facility] does not have a drive-thru to avoid contacting others.

## 2020-08-10 ENCOUNTER — Telehealth: Payer: Self-pay | Admitting: Nurse Practitioner

## 2020-08-10 NOTE — Telephone Encounter (Signed)
Called to Discuss with patient about Covid symptoms and the use of the monoclonal antibody infusion for those with mild to moderate Covid symptoms and at a high risk of hospitalization.     Pt appears to qualify for this infusion due to co-morbid conditions and/or a member of an at-risk group in accordance with the FDA Emergency Use Authorization.    Unable to reach pt   Symptom onset: 12/26 Vaccinated: Unsure Qualified for Infusion: Further review needed  Willette Alma, NP [facility]  [phone]

## 2020-10-11 ENCOUNTER — Encounter: Payer: Self-pay | Admitting: Emergency Medicine

## 2020-10-11 ENCOUNTER — Emergency Department: Payer: Self-pay

## 2020-10-11 ENCOUNTER — Other Ambulatory Visit: Payer: Self-pay

## 2020-10-11 ENCOUNTER — Emergency Department
Admission: EM | Admit: 2020-10-11 | Discharge: 2020-10-11 | Disposition: A | Discharge status: Home / Self Care | Payer: Self-pay | Attending: Emergency Medicine | Admitting: Emergency Medicine

## 2020-10-11 DIAGNOSIS — F17210 Nicotine dependence, cigarettes, uncomplicated: Secondary | ICD-10-CM | POA: Insufficient documentation

## 2020-10-11 DIAGNOSIS — R110 Nausea: Secondary | ICD-10-CM | POA: Insufficient documentation

## 2020-10-11 DIAGNOSIS — R102 Pelvic and perineal pain: Secondary | ICD-10-CM

## 2020-10-11 DIAGNOSIS — N760 Acute vaginitis: Secondary | ICD-10-CM | POA: Insufficient documentation

## 2020-10-11 DIAGNOSIS — R10814 Left lower quadrant abdominal tenderness: Secondary | ICD-10-CM | POA: Insufficient documentation

## 2020-10-11 DIAGNOSIS — B9689 Other specified bacterial agents as the cause of diseases classified elsewhere: Secondary | ICD-10-CM

## 2020-10-11 DIAGNOSIS — Z8742 Personal history of other diseases of the female genital tract: Secondary | ICD-10-CM

## 2020-10-11 LAB — WET PREP, GENITAL
Sperm: NONE SEEN
Trich, Wet Prep: NONE SEEN
Yeast Wet Prep HPF POC: NONE SEEN

## 2020-10-11 LAB — URINALYSIS, COMPLETE (UACMP) WITH MICROSCOPIC
Bacteria, UA: NONE SEEN
Bilirubin Urine: NEGATIVE
Glucose, UA: NEGATIVE mg/dL
Hgb urine dipstick: NEGATIVE
Ketones, ur: NEGATIVE mg/dL
Nitrite: NEGATIVE
Protein, ur: NEGATIVE mg/dL
Specific Gravity, Urine: 1.018 (ref 1.005–1.030)
pH: 6 (ref 5.0–8.0)

## 2020-10-11 LAB — CBC
HCT: 41.7 % (ref 36.0–46.0)
Hemoglobin: 14 g/dL (ref 12.0–15.0)
MCH: 30 pg (ref 26.0–34.0)
MCHC: 33.6 g/dL (ref 30.0–36.0)
MCV: 89.3 fL (ref 80.0–100.0)
Platelets: 194 10*3/uL (ref 150–400)
RBC: 4.67 MIL/uL (ref 3.87–5.11)
RDW: 13.3 % (ref 11.5–15.5)
WBC: 10.3 10*3/uL (ref 4.0–10.5)
nRBC: 0 % (ref 0.0–0.2)

## 2020-10-11 LAB — CHLAMYDIA/NGC RT PCR (ARMC ONLY)
Chlamydia Tr: NOT DETECTED
N gonorrhoeae: NOT DETECTED

## 2020-10-11 LAB — COMPREHENSIVE METABOLIC PANEL
ALT: 15 U/L (ref 0–44)
AST: 20 U/L (ref 15–41)
Albumin: 3.4 g/dL — ABNORMAL LOW (ref 3.5–5.0)
Alkaline Phosphatase: 39 U/L (ref 38–126)
Anion gap: 7 (ref 5–15)
BUN: 15 mg/dL (ref 6–20)
CO2: 24 mmol/L (ref 22–32)
Calcium: 8.7 mg/dL — ABNORMAL LOW (ref 8.9–10.3)
Chloride: 107 mmol/L (ref 98–111)
Creatinine, Ser: 0.63 mg/dL (ref 0.44–1.00)
GFR, Estimated: >60 mL/min (ref >60)
Glucose, Bld: 98 mg/dL (ref 70–99)
Potassium: 4.3 mmol/L (ref 3.5–5.1)
Sodium: 138 mmol/L (ref 135–145)
Total Bilirubin: 0.6 mg/dL (ref 0.3–1.2)
Total Protein: 6.4 g/dL — ABNORMAL LOW (ref 6.5–8.1)

## 2020-10-11 LAB — PREGNANCY, URINE: Preg Test, Ur: NEGATIVE

## 2020-10-11 LAB — LIPASE, BLOOD: Lipase: 30 U/L (ref 11–51)

## 2020-10-11 MED ORDER — METRONIDAZOLE 500 MG PO TABS
500.0000 mg | ORAL_TABLET | Freq: Two times a day (BID) | ORAL | 0 refills | Status: AC
Start: 2020-10-11 — End: 2020-10-18

## 2020-10-11 MED ORDER — METRONIDAZOLE 500 MG PO TABS
500.0000 mg | ORAL_TABLET | Freq: Once | ORAL | Status: AC
  Administered 2020-10-11: 500 mg via ORAL
  Filled 2020-10-11: qty 1

## 2020-10-11 NOTE — ED Notes (Signed)
Pt provided swabs to self swab. Denies questions

## 2020-10-11 NOTE — Discharge Instructions (Signed)
As we discussed, you have evidence of BV or bacterial vaginosis.  You are being discharged with a prescription for 1 week of Flagyl antibiotic to treat this.  Please finish all of these medications, even if your symptoms are getting better.  No signs of UTI.  If your gonorrhea or chlamydia test returns positive, you will get a phone call with this information and prescriptions.   If you develop any worsening symptoms despite these medications, please return to the ED.

## 2020-10-11 NOTE — ED Notes (Signed)
See triage note, pt ambulatory to treatment area. Reports lower abdominal pain, frequent urination, nausea for 1 week. NAD noted

## 2020-10-11 NOTE — ED Notes (Signed)
Signature pad not available, pt verbalizes understanding of d/c instructions, denies questions or concerns. NAD noted. Steady gait 

## 2020-10-11 NOTE — ED Provider Notes (Signed)
[facility] Emergency Department Provider Note ____________________________________________   Event Date/Time   First MD Initiated Contact with Patient 10/11/20 [phone]     (approximate)  I have reviewed the triage vital signs and the nursing notes.  HISTORY  Chief Complaint Abdominal Pain and Urinary Frequency   HPI Kimberly Arroyo is a 35 y.o. femalewho presents to the ED for evaluation of lower abd pain and urinary frequency.   Chart review indicates hx obesity, depression, PCOS.  11/2019 hysteroscopy and D&C through the [facility].  Presents to the ED for evaluation of 1 week of left-sided pelvic/suprapubic abdominal pain.  She reports associated nausea without emesis.  She reports associated increased vaginal discharge, reminiscent of a previous episode of BV.  She reports urinary frequency at baseline, and reports this "might be a little bit worse."  But she denies any hematuria, dysuria.  Denies fevers, chest pain, shortness of breath, emesis, syncope, stool changes or diarrhea.  Past Medical History:  Diagnosis Date  . Depression   . PCOS (polycystic ovarian syndrome)     Patient Active Problem List   Diagnosis Date Noted  . 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 Depression, major, single episode, moderate (HCC) 02/24/2016  . Generalized anxiety disorder 02/24/2016  . Involuntary commitment 02/24/2016    Past Surgical History:  Procedure Laterality Date  . NO PAST SURGERIES      Prior to Admission medications   Medication Sig Start Date End Date Taking? Authorizing Provider  metroNIDAZOLE (FLAGYL) 500 MG tablet Take 1 tablet (500 mg total) by mouth 2 (two) times daily for 7 days. 10/11/20 10/18/20 Yes Delton Prairie, MD  cyclobenzaprine (FLEXERIL) 5 MG tablet Take 1-2 tablets (5-10 mg total) by mouth 3 (three) times daily as needed for muscle spasms. 01/29/20   Evon Slack, PA-C  naproxen (NAPROSYN) 500 MG tablet Take 1 tablet (500 mg total) by  mouth 2 (two) times daily with a meal. 01/29/20   Evon Slack, PA-C  ondansetron (ZOFRAN ODT) 4 MG disintegrating tablet Take 1 tablet (4 mg total) by mouth every 8 (eight) hours as needed for nausea or vomiting. 08/09/20   Arnaldo Natal, MD  oxyCODONE-acetaminophen (PERCOCET/ROXICET) 5-325 MG tablet Take 1 tablet by mouth every 4 (four) hours as needed for severe pain. 05/22/20   Irean Hong, MD    Allergies Patient has no known allergies.  Family History  Problem Relation Age of Onset  . Lung cancer Maternal Grandmother   . Uterine cancer Maternal Grandfather 17       pt has contact    Social History Social History   Tobacco Use  . Smoking status: Current Some Day Smoker    Packs/day: 0.00    Types: Cigarettes    Last attempt to quit: 09/25/2016    Years since quitting: 4.0  . Smokeless tobacco: Never Used  Vaping Use  . Vaping Use: Never used  Substance Use Topics  . Alcohol use: Yes    Comment: occ  . Drug use: No    Review of Systems  Constitutional: No fever/chills Eyes: No visual changes. ENT: No sore throat. Cardiovascular: Denies chest pain. Respiratory: Denies shortness of breath. Gastrointestinal:no vomiting.  No diarrhea.  No constipation. Positive for suprapubic/left pelvic pain.  Positive for nausea. Genitourinary: Negative for dysuria.  Positive for vaginal discharge. Musculoskeletal: Negative for back pain. Skin: Negative for rash. Neurological: Negative for headaches, focal weakness or numbness.   ____________________________________________   PHYSICAL EXAM:  VITAL SIGNS:  Vitals:   10/11/20 0819 10/11/20 1040  BP: (!) 146/82 138/90  Pulse: 68 70  Resp: 18 18  Temp: 97.8 F (36.6 C) 98 F (36.7 C)  SpO2: 99% 100%     Constitutional: Alert and oriented. Well appearing and in no acute distress. Eyes: Conjunctivae are normal. PERRL. EOMI. Head: Atraumatic. Nose: No congestion/rhinnorhea. Mouth/Throat: Mucous membranes are moist.   Oropharynx non-erythematous. Neck: No stridor. No cervical spine tenderness to palpation. Cardiovascular: Normal rate, regular rhythm. Grossly normal heart sounds.  Good peripheral circulation. Respiratory: Normal respiratory effort.  No retractions. Lungs CTAB. Gastrointestinal: Soft , nondistended. No CVA tenderness. Minimal suprapubic and medial LLQ tenderness to palpation.  Abdomen is otherwise benign. Musculoskeletal: No lower extremity tenderness nor edema.  No joint effusions. No signs of acute trauma. Neurologic:  Normal speech and language. No gross focal neurologic deficits are appreciated. No gait instability noted. Skin:  Skin is warm, dry and intact. No rash noted. Psychiatric: Mood and affect are normal. Speech and behavior are normal.  ____________________________________________   LABS (all labs ordered are listed, but only abnormal results are displayed)  Labs Reviewed  WET PREP, GENITAL - Abnormal; Notable for the following components:      Result Value   Clue Cells Wet Prep HPF POC PRESENT (*)    WBC, Wet Prep HPF POC MODERATE (*)    All other components within normal limits  COMPREHENSIVE METABOLIC PANEL - Abnormal; Notable for the following components:   Calcium 8.7 (*)    Total Protein 6.4 (*)    Albumin 3.4 (*)    All other components within normal limits  URINALYSIS, COMPLETE (UACMP) WITH MICROSCOPIC - Abnormal; Notable for the following components:   Color, Urine YELLOW (*)    APPearance HAZY (*)    Leukocytes,Ua TRACE (*)    All other components within normal limits  CHLAMYDIA/NGC RT PCR ([facility] ONLY)  LIPASE, BLOOD  CBC  PREGNANCY, URINE   ____________________________________________  12 Lead EKG   ____________________________________________  RADIOLOGY  ED MD interpretation: TVUS reviewed by me without evidence of torsion  Official radiology report(s): US PELVIC COMPLETE W TRANSVAGINAL AND TORSION R/O  Result Date:  10/11/2020 CLINICAL DATA:  LEFT lower quadrant pain for 2 days, history of ovarian cyst and polycystic ovarian syndrome; acute pain for 1 week. LMP 09/23/2020 EXAM: TRANSABDOMINAL AND TRANSVAGINAL ULTRASOUND OF PELVIS DOPPLER ULTRASOUND OF OVARIES TECHNIQUE: Both transabdominal and transvaginal ultrasound examinations of the pelvis were performed. Transabdominal technique was performed for global imaging of the pelvis including uterus, ovaries, adnexal regions, and pelvic cul-de-sac. It was necessary to proceed with endovaginal exam following the transabdominal exam to visualize the endometrium. Color and duplex Doppler ultrasound was utilized to evaluate blood flow to the ovaries. COMPARISON:  A [id number] FINDINGS: Uterus Measurements: 8.3 x 4.6 x 5.7 cm = volume: 113 mL. Anteverted. Normal morphology without mass. Endometrium Thickness: 8 mm.  No endometrial fluid or focal abnormality Right ovary Measurements: 3.4 x 2.1 x 3.0 cm = volume: 11.6 mL. Normal morphology without mass. Scattered peripheral follicles. Blood flow present within RIGHT ovary on color Doppler imaging. Left ovary Measurements: 3.8 x 2.7 x 2.9 cm = volume: 15.5 mL. Numerous peripheral follicles without dominant mass. Blood flow present within LEFT ovary on color Doppler imaging. Pulsed Doppler evaluation of both ovaries demonstrates low resistance arterial and venous waveforms in both ovaries. Other findings No free pelvic fluid.  No adnexal masses. IMPRESSION: Normal appearing uterus and RIGHT ovary. No evidence of ovarian mass  or torsion. Numerous peripheral follicles throughout the LEFT ovary, consistent with patient history of polycystic ovarian syndrome. Electronically Signed   By: Ulyses Southward M.D.   On: 10/11/2020 10:26    ____________________________________________   PROCEDURES and INTERVENTIONS  Procedure(s) performed (including Critical Care):  .1-3 Lead EKG Interpretation Performed by: Delton Prairie, MD Authorized by: Delton Prairie, MD     Interpretation: normal     ECG rate:  70   ECG rate assessment: normal     Rhythm: sinus rhythm     Ectopy: none     Conduction: normal      Medications  metroNIDAZOLE (FLAGYL) tablet 500 mg (500 mg Oral Given 10/11/20 1038)    ____________________________________________   MDM / ED COURSE   35 year old woman presents to the ED with lower abdominal discomfort and increased vaginal discharge, with evidence of bacterial vaginosis, and ultimately amenable to outpatient management.  Normal vitals on room air.  Exam with mild suprapubic and deep LLQ tenderness to palpation, otherwise benign abdomen.  She looks well otherwise without evidence of dehydration, neurovascular deficits or any distress.  Blood work is unremarkable.  Urine without infectious features to suggest UTI.  No blood to suggest ureterolithiasis.  Wet prep with clue cells indicative of bacterial vaginosis, particular with her symptoms of increased to discharge.  She was started on a course of Flagyl to treat this.  Gonorrhea and chlamydia swabs pending at the time of discharge.  Due to her history of PCOS and worsening LLQ pain, TVUS obtained and demonstrates no evidence of acute ovarian torsion or other ovarian pathology to cause her pain.  We discussed return precautions for the ED and patient is medically stable for outpatient management.   Clinical Course as of 10/11/20 1106  Mon Oct 11, 2020  0900 Educated patient of reassuring urinalysis.  We discussed my recommendation for pelvic ultrasound to assess her ovaries, she is agreeable. [DS]  0944 Pt headed to u/s [DS]  1034 Educated patient of ultrasound and wet prep results.  We discussed the diagnosis of bacterial vaginosis.  We discussed pending GC swab.  We discussed outpatient management and return precautions for the ED.  Patient is agreeable. [DS]    Clinical Course User Index [DS] Delton Prairie, MD     ____________________________________________   FINAL CLINICAL IMPRESSION(S) / ED DIAGNOSES  Final diagnoses:  Bacterial vaginosis     ED Discharge Orders         Ordered    metroNIDAZOLE (FLAGYL) 500 MG tablet  2 times daily        10/11/20 1031           Dylan Smith   Note:  This document was prepared using Conservation officer, historic buildings and may include unintentional dictation errors.   Delton Prairie, MD 10/11/20 [phone]

## 2020-10-11 NOTE — ED Triage Notes (Signed)
Pt in w/low medial, sharp abdominal pain x 1 wk. Reports frequent urination off and on throughout past month. Pain tender on palpation. LMP 2/10. Denies any abdominal cramping or bleeding. Normal BM's

## 2020-12-11 IMAGING — DX PORTABLE CHEST - 1 VIEW
1 series · 1 of 1 positions shown · non-contrast
Comparison: Chest x-ray 10/12/2015.

CLINICAL DATA: 32-year-old female with history of right-sided chest
pain radiating from the front to the back.

EXAM:
PORTABLE CHEST 1 VIEW

[chest ap]
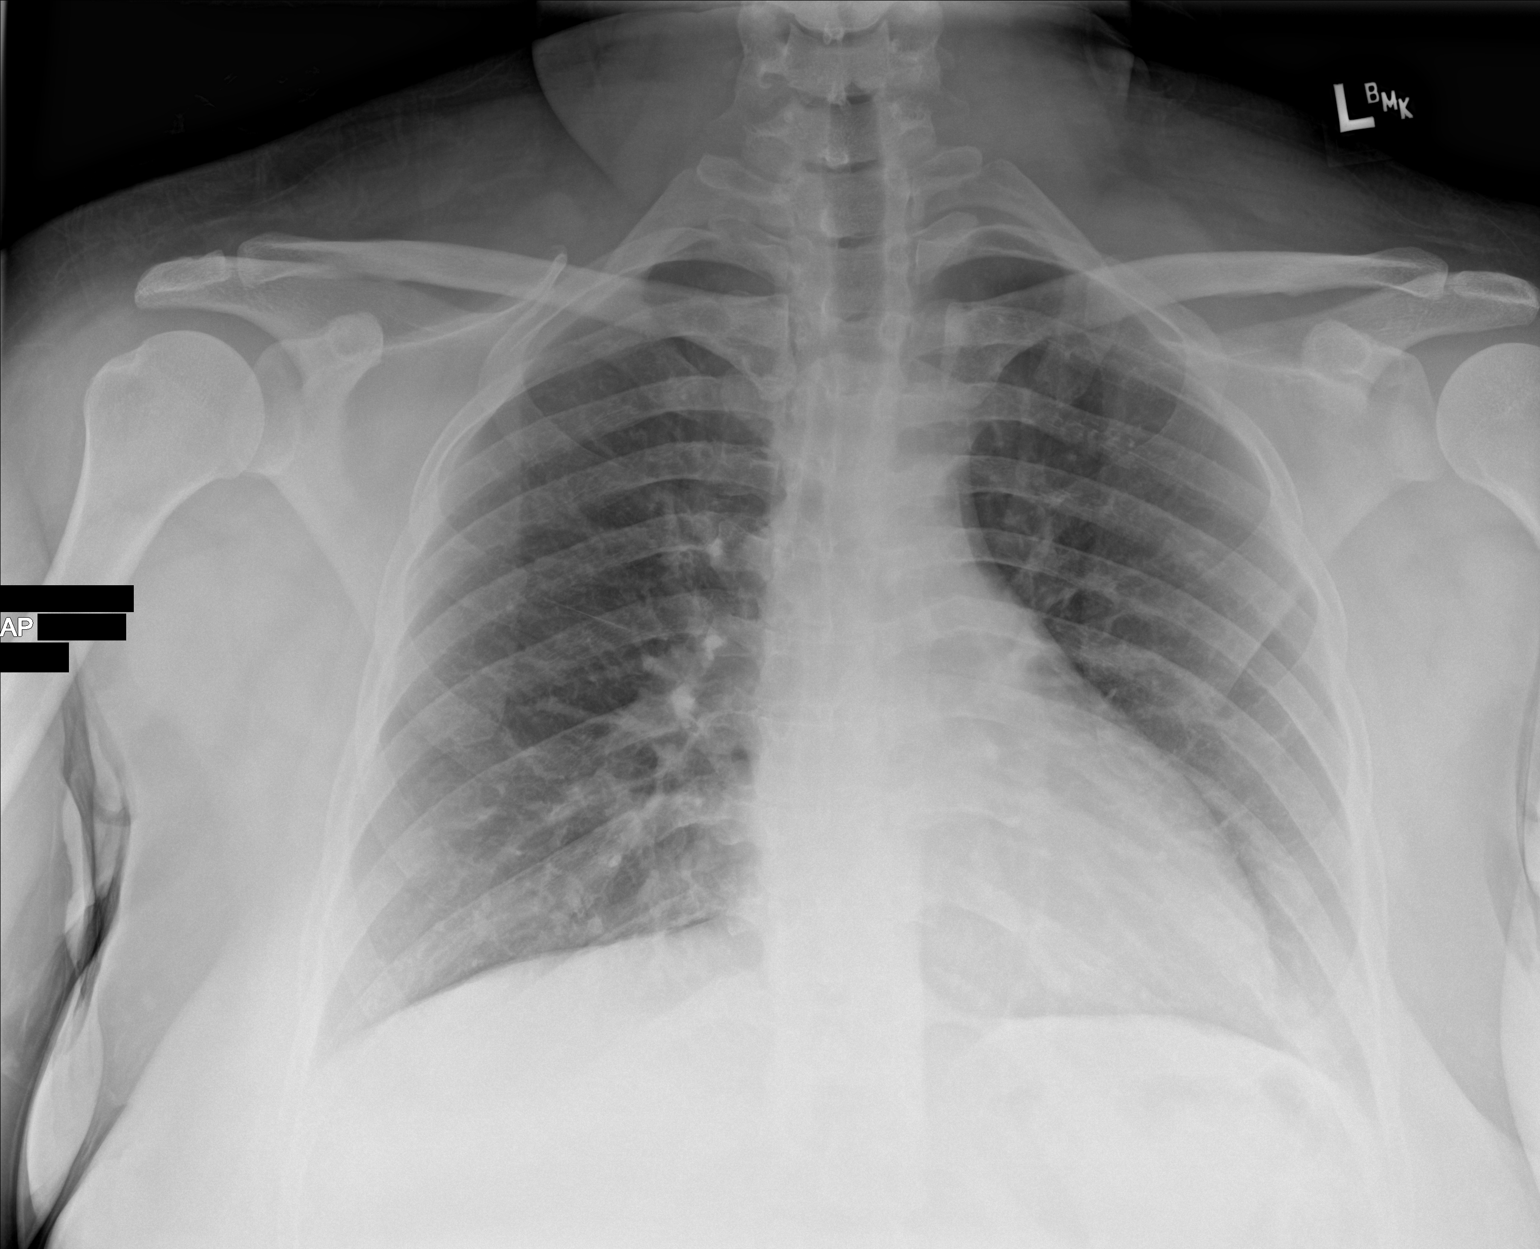

[1 of 1 positions shown; findings below may reference images not displayed]

FINDINGS: Lung volumes are normal. No consolidative airspace disease. No
pleural effusions. No pneumothorax. No pulmonary nodule or mass
noted. Pulmonary vasculature and the cardiomediastinal silhouette
are within normal limits.
IMPRESSION: No radiographic evidence of acute cardiopulmonary disease.

## 2020-12-28 ENCOUNTER — Emergency Department
Admission: EM | Admit: 2020-12-28 | Discharge: 2020-12-28 | Disposition: A | Discharge status: Home / Self Care | Payer: Self-pay | Attending: Emergency Medicine | Admitting: Emergency Medicine

## 2020-12-28 ENCOUNTER — Encounter: Payer: Self-pay | Admitting: Emergency Medicine

## 2020-12-28 ENCOUNTER — Emergency Department: Payer: Self-pay

## 2020-12-28 ENCOUNTER — Other Ambulatory Visit: Payer: Self-pay

## 2020-12-28 DIAGNOSIS — J069 Acute upper respiratory infection, unspecified: Secondary | ICD-10-CM

## 2020-12-28 DIAGNOSIS — F17210 Nicotine dependence, cigarettes, uncomplicated: Secondary | ICD-10-CM | POA: Insufficient documentation

## 2020-12-28 DIAGNOSIS — U071 COVID-19: Secondary | ICD-10-CM | POA: Insufficient documentation

## 2020-12-28 MED ORDER — BENZONATATE 100 MG PO CAPS: 100.0000 mg | ORAL_CAPSULE | Freq: Three times a day (TID) | ORAL | 0 refills | Status: AC | PRN

## 2020-12-28 MED ORDER — ACETAMINOPHEN 325 MG PO TABS
650.0000 mg | ORAL_TABLET | Freq: Once | ORAL | Status: AC
  Administered 2020-12-28: 650 mg via ORAL
  Filled 2020-12-28: qty 2

## 2020-12-28 MED ORDER — BENZONATATE 100 MG PO CAPS
100.0000 mg | ORAL_CAPSULE | Freq: Once | ORAL | Status: AC
  Administered 2020-12-28: 100 mg via ORAL
  Filled 2020-12-28: qty 1

## 2020-12-28 NOTE — ED Provider Notes (Signed)
[facility] Emergency Department Provider Note  ____________________________________________   Event Date/Time   First MD Initiated Contact with Patient 12/28/20 2011     (approximate)  I have reviewed the triage vital signs and the nursing notes.   HISTORY  Chief Complaint Nasal Congestion, Cough, and Generalized Body Aches  HPI Kimberly Arroyo is a 35 y.o. female who presents to the emergency department for evaluation of nasal congestion, cough, body aches and chills that began yesterday and worsened today.  Patient denies any known sick contacts.  She reports a mild pain in the anterior aspect of her chest with coughing, denies any at rest.  She denies any nausea, vomiting, abdominal pain.  States she has a history of allergies, but that these are more severe symptoms than her usual.       Past Medical History:  Diagnosis Date  . Depression   . PCOS (polycystic ovarian syndrome)     Patient Active Problem List   Diagnosis Date Noted  . 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 Depression, major, single episode, moderate (HCC) 02/24/2016  . Generalized anxiety disorder 02/24/2016  . Involuntary commitment 02/24/2016    Past Surgical History:  Procedure Laterality Date  . NO PAST SURGERIES      Prior to Admission medications   Medication Sig Start Date End Date Taking? Authorizing Provider  benzonatate (TESSALON PERLES) 100 MG capsule Take 1 capsule (100 mg total) by mouth 3 (three) times daily as needed for cough. 12/28/20 12/28/21 Yes Nafeesah Lapaglia, Ruben Gottron, PA  cyclobenzaprine (FLEXERIL) 5 MG tablet Take 1-2 tablets (5-10 mg total) by mouth 3 (three) times daily as needed for muscle spasms. 01/29/20   Evon Slack, PA-C  naproxen (NAPROSYN) 500 MG tablet Take 1 tablet (500 mg total) by mouth 2 (two) times daily with a meal. 01/29/20   Evon Slack, PA-C  ondansetron (ZOFRAN ODT) 4 MG disintegrating tablet Take 1 tablet (4 mg total) by mouth every 8  (eight) hours as needed for nausea or vomiting. 08/09/20   Arnaldo Natal, MD  oxyCODONE-acetaminophen (PERCOCET/ROXICET) 5-325 MG tablet Take 1 tablet by mouth every 4 (four) hours as needed for severe pain. 05/22/20   Irean Hong, MD    Allergies Patient has no known allergies.  Family History  Problem Relation Age of Onset  . Lung cancer Maternal Grandmother   . Uterine cancer Maternal Grandfather 61       pt has contact    Social History Social History   Tobacco Use  . Smoking status: Current Some Day Smoker    Packs/day: 0.00    Types: Cigarettes    Last attempt to quit: 09/25/2016    Years since quitting: 4.2  . Smokeless tobacco: Never Used  Vaping Use  . Vaping Use: Never used  Substance Use Topics  . Alcohol use: Yes    Comment: occ  . Drug use: No    Review of Systems Constitutional: No fever/chills Eyes: No visual changes. ENT: + Nasal congestion, no sore throat. Cardiovascular: Denies chest pain. Respiratory: + Cough, denies shortness of breath. Gastrointestinal: No abdominal pain.  No nausea, no vomiting.  No diarrhea.  No constipation. Genitourinary: Negative for dysuria. Musculoskeletal: Negative for back pain. Skin: Negative for rash. Neurological: Negative for headaches, focal weakness or numbness.   ____________________________________________   PHYSICAL EXAM:  VITAL SIGNS: ED Triage Vitals  Enc Vitals Group     BP 12/28/20 1949 122/70     Pulse Rate 12/28/20  1949 99     Resp 12/28/20 1949 18     Temp 12/28/20 1949 99.2 F (37.3 C)     Temp Source 12/28/20 1949 Oral     SpO2 12/28/20 1949 96 %     Weight 12/28/20 1950 250 lb (113.4 kg)     Height 12/28/20 1950 5\' 5"  (1.651 m)     Head Circumference --      Peak Flow --      Pain Score 12/28/20 1953 3     Pain Loc --      Pain Edu? --      Excl. in GC? --    Constitutional: Alert and oriented. Well appearing and in no acute distress. Eyes: Conjunctivae are normal. PERRL.  EOMI. Head: Atraumatic. Nose: Copious congestion/rhinnorhea. Mouth/Throat: Mucous membranes are moist.  Oropharynx with cobblestoning present.  No significant erythema. Neck: No stridor.   Cardiovascular: Normal rate, regular rhythm. Grossly normal heart sounds.  Good peripheral circulation. Respiratory: Normal respiratory effort.  No retractions. Lungs CTAB. Gastrointestinal: Soft and nontender. No distention. No abdominal bruits. No CVA tenderness. Musculoskeletal: No lower extremity tenderness nor edema.  No joint effusions. Neurologic:  Normal speech and language. No gross focal neurologic deficits are appreciated. No gait instability. Skin:  Skin is warm, dry and intact. No rash noted. Psychiatric: Mood and affect are normal. Speech and behavior are normal.  ____________________________________________   LABS (all labs ordered are listed, but only abnormal results are displayed)  Labs Reviewed  RESP PANEL BY RT-PCR (FLU A&B, COVID) [id number]    ____________________________________________  RADIOLOGY I, 12/30/20, personally viewed and evaluated these images (plain radiographs) as part of my medical decision making, as well as reviewing the written report by the radiologist.  ED provider interpretation: Chest x-ray with no focal pneumonia identified, no other obvious acute abnormalities  ____________________________________________   INITIAL IMPRESSION / ASSESSMENT AND PLAN / ED COURSE  As part of my medical decision making, I reviewed the following data within the electronic MEDICAL RECORD NUMBER Nursing notes reviewed and incorporated, Radiograph reviewed and Notes from prior ED visits        Patient is a 35 year old female who presents to the emergency department for evaluation of nasal congestion, cough, chills and body aches that began yesterday and worsened today.  See HPI for further details.  In triage, patient has a temperature of 99.2, otherwise vitals are grossly  unremarkable.  On physical exam she does have a copious amount nasal congestion, however lung sounds are clear and she does not have any difficulty breathing.  Suspect that these are likely viral URI symptoms, however given her recent allergies with nasal congestion that has persisted for some time, will obtain chest x-ray to rule out pneumonia.  Chest x-ray without any obvious pneumonia.  Will send COVID and flu swab, patient was advised on how to get these results in her MyChart.  Will treat cough with Tessalon Perles, encouraged Tylenol and ibuprofen for body aches and fever.  Return precautions were discussed, she stable this time for outpatient management.      ____________________________________________   FINAL CLINICAL IMPRESSION(S) / ED DIAGNOSES  Final diagnoses:  Viral URI with cough     ED Discharge Orders         Ordered    benzonatate (TESSALON PERLES) 100 MG capsule  3 times daily PRN        12/28/20 2208          *Please note:  Goldy Calandra was evaluated in Emergency Department on 12/28/2020 for the symptoms described in the history of present illness. She was evaluated in the context of the global COVID-19 pandemic, which necessitated consideration that the patient might be at risk for infection with the SARS-CoV-2 virus that causes COVID-19. Institutional protocols and algorithms that pertain to the evaluation of patients at risk for COVID-19 are in a state of rapid change based on information released by regulatory bodies including the CDC and federal and state organizations. These policies and algorithms were followed during the patient's care in the ED.  Some ED evaluations and interventions may be delayed as a result of limited staffing during and the pandemic.*   Note:  This document was prepared using Dragon voice recognition software and may include unintentional dictation errors.   Lucy Chris, PA 12/28/20 2348    Delton Prairie, MD 01/01/21 [phone]

## 2020-12-28 NOTE — Discharge Instructions (Signed)
Please monitor your MyChart for your COVID and flu results.  Please use the Tessalon Perles as prescribed to the pharmacy for your cough symptoms.  You may also use Tylenol and ibuprofen as needed for fever/chills.  Please return to the emergency department if you experience any worsening of symptoms, otherwise follow-up with primary care.

## 2020-12-28 NOTE — ED Triage Notes (Signed)
PT arrived via POV with reports of body aches, congestion, cough sxs began today, pt states she has been struggling with allergies for a while.  Pt states she took some mucus relief medication from [facility] today with no relief.

## 2020-12-28 NOTE — ED Notes (Signed)
Pt verbalized understanding of d/c instructions at this time. Prescriptions and follow-up care reviewed at this time. Pt ambulatory to ED lobby, NAD noted, RR even and unlabored.  

## 2020-12-29 LAB — RESP PANEL BY RT-PCR (FLU A&B, COVID) ARPGX2
Influenza A by PCR: NEGATIVE
Influenza B by PCR: NEGATIVE
SARS Coronavirus 2 by RT PCR: POSITIVE — AB

## 2020-12-30 ENCOUNTER — Telehealth: Payer: Self-pay

## 2020-12-30 NOTE — Telephone Encounter (Signed)
Called to discuss with patient about COVID-19 symptoms and the use of one of the available treatments for those with mild to moderate Covid symptoms and at a high risk of hospitalization.  Pt appears to qualify for outpatient treatment due to co-morbid conditions and/or a member of an at-risk group in accordance with the FDA Emergency Use Authorization.    Symptom onset: 12/27/20 Body aches,cough Vaccinated: Unknown Booster? Unknown Immunocompromised? No Qualifiers: Obesity NIH Criteria: Tier 2  Unable to reach pt - Mailbox full, unable to leave message.   Esther Hardy

## 2021-03-04 ENCOUNTER — Ambulatory Visit: Payer: Self-pay | Admitting: Advanced Practice Midwife

## 2021-03-04 ENCOUNTER — Ambulatory Visit: Payer: Self-pay

## 2021-03-04 ENCOUNTER — Other Ambulatory Visit: Payer: Self-pay

## 2021-03-04 ENCOUNTER — Encounter: Payer: Self-pay | Admitting: Advanced Practice Midwife

## 2021-03-04 DIAGNOSIS — Z113 Encounter for screening for infections with a predominantly sexual mode of transmission: Secondary | ICD-10-CM

## 2021-03-04 DIAGNOSIS — Z7289 Other problems related to lifestyle: Secondary | ICD-10-CM

## 2021-03-04 LAB — WET PREP FOR TRICH, YEAST, CLUE
Trichomonas Exam: NEGATIVE
Yeast Exam: NEGATIVE

## 2021-03-04 NOTE — Progress Notes (Signed)
[facility] Department STI clinic/screening visit  Subjective:  Kimberly Arroyo is a 35 y.o. SWF G4P3 exsmoker female being seen today for an STI screening visit. The patient reports they do not have symptoms.  Patient reports that they do not desire a pregnancy in the next year.   They reported they are not interested in discussing contraception today.  Patient's last menstrual period was 02/20/2021.   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 She has no sxs. Last sex 02/26/21 without condom; with current partner x 1 mo; 1 partner in last 3 mo. LMP 02/20/21. Last MJ age 89. Last ETOH 02/05/21 (1 mixed drink) q 2 mo. Last cig 07/2020. Last cigar 5 years ago.   Last HIV test per patient/review of record was 09/30/19 Patient reports last pap was 10/28/19 neg  See flowsheet for further details and programmatic requirements.    The following portions of the patient's history were reviewed and updated as appropriate: allergies, current medications, past medical history, past social history, past surgical history and problem list.  Objective:  There were no vitals filed for this visit.  Physical Exam Vitals and nursing note reviewed.  Constitutional:      Appearance: Normal appearance. She is obese.  HENT:     Head: Normocephalic and atraumatic.     Mouth/Throat:     Mouth: Mucous membranes  are moist.     Pharynx: Oropharynx is clear. No oropharyngeal exudate or posterior oropharyngeal erythema.  Eyes:     Conjunctiva/sclera: Conjunctivae normal.  Pulmonary:     Effort: Pulmonary effort is normal.  Chest:  Breasts:    Right: No axillary adenopathy or supraclavicular adenopathy.     Left: No axillary adenopathy or supraclavicular adenopathy.  Abdominal:     Palpations: Abdomen is soft. There is no mass.     Tenderness: There is no abdominal tenderness. There is no rebound.  Genitourinary:    General: Normal vulva.     Exam position: Lithotomy position.     Pubic Area: No rash or pubic lice.      Labia:        Right: No rash or lesion.        Left: No rash or lesion.      Vagina: Normal. No vaginal discharge (white creamy leukorrhea, ph>4.5), erythema, bleeding or lesions.     Cervix: No cervical motion tenderness, discharge, friability, lesion or erythema.     Uterus: Normal.      Adnexa: Right adnexa normal and left adnexa normal.     Rectum: Normal.  Lymphadenopathy:     Head:     Right side of head: No preauricular or posterior auricular adenopathy.     Left side of head: No preauricular or posterior auricular adenopathy.     Cervical: No cervical adenopathy.     Right cervical: No superficial, deep or posterior cervical adenopathy.  Left cervical: No superficial, deep or posterior cervical adenopathy.     Upper Body:     Right upper body: No supraclavicular or axillary adenopathy.     Left upper body: No supraclavicular or axillary adenopathy.     Lower Body: No right inguinal adenopathy. No left inguinal adenopathy.  Skin:    General: Skin is warm and dry.     Findings: No rash.  Neurological:     Mental Status: She is alert and oriented to person, place, and time.     Assessment and Plan:  Lenzi Marmo is a 35 y.o. female presenting to the [facility] for STI screening  1. Screening examination for venereal disease Treat wet  mount per standing orders Immunization nurse consult - WET PREP FOR TRICH, YEAST, CLUE - Syphilis Serology, [place] [facility] - Chlamydia/Gonorrhea [facility] - HIV  LAB - Gonococcus culture  2. Morbid obesity (HCC) 250 lbs   3. Self-mutilation (cutter)      No follow-ups on file.  No future appointments.  Alberteen Spindle, CNM

## 2021-03-04 NOTE — Progress Notes (Signed)
Pt here for STD screening.  Wet mount results reviewed, no treatment required. Pt given condoms. [place] Masaki M Maveric Debono, RN  

## 2021-03-08 LAB — GONOCOCCUS CULTURE

## 2021-03-10 LAB — HM HIV SCREENING LAB: HM HIV Screening: NEGATIVE

## 2021-03-26 ENCOUNTER — Emergency Department: Payer: Self-pay

## 2021-03-26 ENCOUNTER — Emergency Department
Admission: EM | Admit: 2021-03-26 | Discharge: 2021-03-26 | Disposition: A | Discharge status: Home / Self Care | Payer: Self-pay | Attending: Emergency Medicine | Admitting: Emergency Medicine

## 2021-03-26 ENCOUNTER — Other Ambulatory Visit: Payer: Self-pay

## 2021-03-26 DIAGNOSIS — M25472 Effusion, left ankle: Secondary | ICD-10-CM | POA: Insufficient documentation

## 2021-03-26 DIAGNOSIS — M25471 Effusion, right ankle: Secondary | ICD-10-CM | POA: Insufficient documentation

## 2021-03-26 DIAGNOSIS — R0602 Shortness of breath: Secondary | ICD-10-CM | POA: Insufficient documentation

## 2021-03-26 DIAGNOSIS — R202 Paresthesia of skin: Secondary | ICD-10-CM | POA: Insufficient documentation

## 2021-03-26 DIAGNOSIS — R0600 Dyspnea, unspecified: Secondary | ICD-10-CM | POA: Insufficient documentation

## 2021-03-26 DIAGNOSIS — Z87891 Personal history of nicotine dependence: Secondary | ICD-10-CM | POA: Insufficient documentation

## 2021-03-26 LAB — COMPREHENSIVE METABOLIC PANEL
ALT: 18 U/L (ref 0–44)
AST: 24 U/L (ref 15–41)
Albumin: 3.6 g/dL (ref 3.5–5.0)
Alkaline Phosphatase: 58 U/L (ref 38–126)
Anion gap: 11 (ref 5–15)
BUN: 13 mg/dL (ref 6–20)
CO2: 21 mmol/L — ABNORMAL LOW (ref 22–32)
Calcium: 8.7 mg/dL — ABNORMAL LOW (ref 8.9–10.3)
Chloride: 106 mmol/L (ref 98–111)
Creatinine, Ser: 0.8 mg/dL (ref 0.44–1.00)
GFR, Estimated: >60 mL/min (ref >60)
Glucose, Bld: 125 mg/dL — ABNORMAL HIGH (ref 70–99)
Potassium: 3.8 mmol/L (ref 3.5–5.1)
Sodium: 138 mmol/L (ref 135–145)
Total Bilirubin: 0.9 mg/dL (ref 0.3–1.2)
Total Protein: 7.4 g/dL (ref 6.5–8.1)

## 2021-03-26 LAB — CBC WITH DIFFERENTIAL/PLATELET
Abs Immature Granulocytes: 0.02 10*3/uL (ref 0.00–0.07)
Basophils Absolute: 0 10*3/uL (ref 0.0–0.1)
Basophils Relative: 1 %
Eosinophils Absolute: 0.2 10*3/uL (ref 0.0–0.5)
Eosinophils Relative: 3 %
HCT: 41 % (ref 36.0–46.0)
Hemoglobin: 14.3 g/dL (ref 12.0–15.0)
Immature Granulocytes: 0 %
Lymphocytes Relative: 24 %
Lymphs Abs: 2 10*3/uL (ref 0.7–4.0)
MCH: 30.8 pg (ref 26.0–34.0)
MCHC: 34.9 g/dL (ref 30.0–36.0)
MCV: 88.2 fL (ref 80.0–100.0)
Monocytes Absolute: 0.6 10*3/uL (ref 0.1–1.0)
Monocytes Relative: 7 %
Neutro Abs: 5.6 10*3/uL (ref 1.7–7.7)
Neutrophils Relative %: 65 %
Platelets: 239 10*3/uL (ref 150–400)
RBC: 4.65 MIL/uL (ref 3.87–5.11)
RDW: 13 % (ref 11.5–15.5)
WBC: 8.4 10*3/uL (ref 4.0–10.5)
nRBC: 0 % (ref 0.0–0.2)

## 2021-03-26 LAB — URINALYSIS, COMPLETE (UACMP) WITH MICROSCOPIC
Bacteria, UA: NONE SEEN
Bilirubin Urine: NEGATIVE
Glucose, UA: NEGATIVE mg/dL
Ketones, ur: NEGATIVE mg/dL
Leukocytes,Ua: NEGATIVE
Nitrite: NEGATIVE
Protein, ur: NEGATIVE mg/dL
Specific Gravity, Urine: 1.024 (ref 1.005–1.030)
pH: 5 (ref 5.0–8.0)

## 2021-03-26 LAB — POC URINE PREG, ED: Preg Test, Ur: NEGATIVE

## 2021-03-26 LAB — D-DIMER, QUANTITATIVE: D-Dimer, Quant: 0.6 ug/mL-FEU — ABNORMAL HIGH (ref 0.00–0.50)

## 2021-03-26 MED ORDER — IOHEXOL 350 MG/ML SOLN
75.0000 mL | Freq: Once | INTRAVENOUS | Status: AC | PRN
  Administered 2021-03-26: 75 mL via INTRAVENOUS

## 2021-03-26 NOTE — ED Notes (Signed)
ED Provider at bedside. 

## 2021-03-26 NOTE — Discharge Instructions (Addendum)
Your chest x-ray was normal as was your blood work and the CAT scan of your chest did not show any blood clot in your lungs.  We are not exactly sure what is causing your difficulty breathing.  Please follow-up with your primary care provider for further evaluation.

## 2021-03-26 NOTE — ED Provider Notes (Signed)
[facility]  ____________________________________________   Event Date/Time   First MD Initiated Contact with Patient 03/26/21 [phone]     (approximate)  I have reviewed the triage vital signs and the nursing notes.   HISTORY  Chief Complaint Shortness of Breath    HPI Kimberly Arroyo is a 35 y.o. female past medical history of depression and PCOS who presents with dyspnea and lower extremity swelling.  Symptom onset about several weeks ago.  She endorses feeling short of breath when she bends over and when she kneels down.  She is not dyspneic otherwise.  She denies chest pain.  Also notes that the bilateral ankles have been swollen.  Today it is mainly her left ankle.  She denies pain in the calves.  Also notes that her arms have been intermittently tingling but denies neck pain.  She denies cough, fevers or chills. The patient denies hx of prior DVT/PE, unilateral leg pain/swelling, hormone use, recent surgery, hx of cancer, prolonged immobilization, or hemoptysis.         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 Authorizing Provider  benzonatate (TESSALON PERLES) 100 MG capsule Take 1 capsule (100 mg total) by mouth 3 (three) times daily as needed for cough. Patient not taking: No sig reported 12/28/20 12/28/21  Lucy Chris, PA  cyclobenzaprine (FLEXERIL) 5 MG tablet Take 1-2 tablets (5-10 mg total) by mouth 3 (three) times daily as  needed for muscle spasms. Patient not taking: No sig reported 01/29/20   Evon Slack, PA-C  naproxen (NAPROSYN) 500 MG tablet Take 1 tablet (500 mg total) by mouth 2 (two) times daily with a meal. Patient not taking: No sig reported 01/29/20   Evon Slack, PA-C  ondansetron (ZOFRAN ODT) 4 MG disintegrating tablet Take 1 tablet (4 mg total) by mouth every 8 (eight) hours as needed for nausea or vomiting. Patient not taking: No sig reported 08/09/20   Arnaldo Natal, MD  oxyCODONE-acetaminophen (PERCOCET/ROXICET) 5-325 MG tablet Take 1 tablet by mouth every 4 (four) hours as needed for severe pain. Patient not taking: No sig reported 05/22/20   Irean Hong, MD    Allergies Patient has no known allergies.  Family History  Problem Relation Age of Onset   Lung cancer Maternal Grandmother    Uterine cancer Maternal Grandfather 88       pt has contact    Social History Social History   Tobacco Use   Smoking status: Former    Packs/day: 0.00    Types: Cigarettes, Cigars    Quit date: 09/25/2016    Years since quitting: 4.5   Smokeless tobacco: Never  Vaping Use   Vaping Use: Never used  Substance Use Topics   Alcohol use: Yes    Alcohol/week: 1.0 standard drink    Types: 1 Standard drinks or equivalent per week    Comment: last use 02/05/21 q 2 mo   Drug use: Not  Currently    Types: Marijuana    Comment: last use age 89    Review of Systems   Review of Systems  Physical Exam Updated Vital Signs BP 103/73 (BP Location: Left Arm)   Pulse 70   Temp 98.1 F (36.7 C) (Oral)   Resp 18   Ht 5\' 5"  (1.651 m)   Wt 113.4 kg   LMP 03/23/2021   SpO2 99%   BMI 41.60 kg/m   Physical Exam   LABS (all labs ordered are listed, but only abnormal results are displayed)  Labs Reviewed  COMPREHENSIVE METABOLIC PANEL - Abnormal; Notable for the following components:      Result Value   CO2 21 (*)    Glucose, Bld 125 (*)    Calcium 8.7 (*)    All other components within  normal limits  D-DIMER, QUANTITATIVE - Abnormal; Notable for the following components:   D-Dimer, Quant 0.60 (*)    All other components within normal limits  URINALYSIS, COMPLETE (UACMP) WITH MICROSCOPIC - Abnormal; Notable for the following components:   Color, Urine YELLOW (*)    APPearance HAZY (*)    Hgb urine dipstick LARGE (*)    All other components within normal limits  CBC WITH DIFFERENTIAL/PLATELET  POC URINE PREG, ED   ____________________________________________  EKG  Normal sinus rhythm with sinus arrhythmia, normal axis, normal intervals, no acute ischemic changes ____________________________________________  RADIOLOGY I, 05/23/2021, personally viewed and evaluated these images (plain radiographs) as part of my medical decision making, as well as reviewing the written report by the radiologist.  ED MD interpretation: Reviewed the chest x-ray which does not show any acute cardiopulmonary process    ____________________________________________   PROCEDURES  Procedure(s) performed (including Critical Care):  Procedures   ____________________________________________   INITIAL IMPRESSION / ASSESSMENT AND PLAN / ED COURSE     35 year old female presents with dyspnea.  Her vital signs within normal limits.  EKG nonischemic and chest x-ray normal.  She is satting 99% is not tachycardic.  Patient is PERC negative.  However D-dimer obtained in triage is elevated at 0.6.  Therefore will obtain CT angio to rule out pulmonary embolism however my suspicion is low.  CT angios negative.  Patient is stable for discharge.  PCP follow-up.  Clinical Course as of 03/26/21 1617  Sat Mar 26, 2021  0914 Preg Test, Ur: Negative [KM]  0915 Hgb urine dipstick(!): LARGE Pt on her period  [KM]  1037 IMPRESSION: Normal exam. No pulmonary embolism. Lungs are clear.    [KM]    Clinical Course User Index [KM] Mar 28, 2021, MD      ____________________________________________   FINAL CLINICAL IMPRESSION(S) / ED DIAGNOSES  Final diagnoses:  None     ED Discharge Orders     None        Note:  This document was prepared using Dragon voice recognition software and may include unintentional dictation errors.    Georga Hacking, MD 03/26/21 [phone]

## 2021-03-26 NOTE — ED Triage Notes (Signed)
Bending over cant catch breath, 2 neg home covid tests, L>R ankle swelling. Denies recent travels

## 2021-04-12 ENCOUNTER — Emergency Department
Admission: EM | Admit: 2021-04-12 | Discharge: 2021-04-12 | Disposition: A | Discharge status: Home / Self Care | Payer: Self-pay | Attending: Emergency Medicine | Admitting: Emergency Medicine

## 2021-04-12 ENCOUNTER — Other Ambulatory Visit: Payer: Self-pay

## 2021-04-12 DIAGNOSIS — B379 Candidiasis, unspecified: Secondary | ICD-10-CM | POA: Insufficient documentation

## 2021-04-12 DIAGNOSIS — N760 Acute vaginitis: Secondary | ICD-10-CM | POA: Insufficient documentation

## 2021-04-12 DIAGNOSIS — B3731 Acute candidiasis of vulva and vagina: Secondary | ICD-10-CM

## 2021-04-12 DIAGNOSIS — Z87891 Personal history of nicotine dependence: Secondary | ICD-10-CM | POA: Insufficient documentation

## 2021-04-12 DIAGNOSIS — B373 Candidiasis of vulva and vagina: Secondary | ICD-10-CM

## 2021-04-12 LAB — WET PREP, GENITAL
Clue Cells Wet Prep HPF POC: NONE SEEN
Sperm: NONE SEEN
Trich, Wet Prep: NONE SEEN

## 2021-04-12 LAB — CHLAMYDIA/NGC RT PCR (ARMC ONLY)
Chlamydia Tr: NOT DETECTED
N gonorrhoeae: NOT DETECTED

## 2021-04-12 LAB — URINALYSIS, ROUTINE W REFLEX MICROSCOPIC
Bilirubin Urine: NEGATIVE
Glucose, UA: NEGATIVE mg/dL
Hgb urine dipstick: NEGATIVE
Ketones, ur: NEGATIVE mg/dL
Nitrite: NEGATIVE
Protein, ur: NEGATIVE mg/dL
Specific Gravity, Urine: 1.021 (ref 1.005–1.030)
pH: 6 (ref 5.0–8.0)

## 2021-04-12 MED ORDER — FLUCONAZOLE 150 MG PO TABS: 150.0000 mg | ORAL_TABLET | Freq: Every day | ORAL | 1 refills | Status: AC

## 2021-04-12 MED ORDER — HYDROXYZINE HCL 25 MG PO TABS: 25.0000 mg | ORAL_TABLET | Freq: Three times a day (TID) | ORAL | 0 refills | Status: AC | PRN

## 2021-04-12 NOTE — ED Triage Notes (Signed)
Pt to ED for vaginal itching x4 days. States took [employer] with no relief NAD, playing on phone in triage.  ambulatory

## 2021-04-12 NOTE — ED Notes (Signed)
See triage note  Presents with some vaginal itching with slight pain

## 2021-04-12 NOTE — Discharge Instructions (Addendum)
Take Diflucan. Repeat treatment in one week if still symptomatic

## 2021-04-12 NOTE — ED Provider Notes (Signed)
[facility]-EMERGENCY DEPARTMENT  ____________________________________________  Time seen: Approximately 4:34 PM  I have reviewed the triage vital signs and the nursing notes.   HISTORY  Chief Complaint Vaginal Itching   Historian Patient     HPI Kimberly Arroyo is a 35 y.o. female presents to the emergency department with vaginal itching for the past 4 days.  Patient states that she has had recent STD testing which tested negative.  Denies recent antibiotic use.  No changes in vaginal odor.  Patient reports that she is prone to both BV and yeast vaginitis.  No deep dyspareunia.  No other alleviating measures have been attempted.   Past Medical History:  Diagnosis Date   Depression    PCOS (polycystic ovarian syndrome)      Immunizations up to date:  Yes.     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 Authorizing Provider  fluconazole (DIFLUCAN) 150 MG tablet Take 1 tablet (150 mg total) by mouth daily for 1 day. 04/12/21 04/13/21 Yes Pia Mau M, PA-C  hydrOXYzine (ATARAX/VISTARIL) 25 MG tablet Take 1 tablet (25 mg total) by mouth every 8 (eight) hours as needed for up to 5 days. 04/12/21 04/17/21 Yes Pia Mau M, PA-C  benzonatate (TESSALON PERLES) 100 MG capsule Take 1 capsule (100 mg total) by mouth 3 (three) times daily as needed for cough. Patient not taking: No sig reported 12/28/20 12/28/21  Lucy Chris, PA  cyclobenzaprine (FLEXERIL) 5 MG tablet  Take 1-2 tablets (5-10 mg total) by mouth 3 (three) times daily as needed for muscle spasms. Patient not taking: No sig reported 01/29/20   Evon Slack, PA-C  naproxen (NAPROSYN) 500 MG tablet Take 1 tablet (500 mg total) by mouth 2 (two) times daily with a meal. Patient not taking: No sig reported 01/29/20   Evon Slack, PA-C  ondansetron (ZOFRAN ODT) 4 MG disintegrating tablet Take 1 tablet (4 mg total) by mouth every 8 (eight) hours as needed for nausea or vomiting. Patient not taking: No sig reported 08/09/20   Arnaldo Natal, MD  oxyCODONE-acetaminophen (PERCOCET/ROXICET) 5-325 MG tablet Take 1 tablet by mouth every 4 (four) hours as needed for severe pain. Patient not taking: No sig reported 05/22/20   Irean Hong, MD    Allergies Patient has no known allergies.  Family History  Problem Relation Age of Onset   Lung cancer Maternal Grandmother    Uterine cancer Maternal Grandfather 27       pt has contact    Social History Social History   Tobacco Use   Smoking status: Former    Packs/day: 0.00    Types: Cigarettes, Cigars    Quit date: 09/25/2016    Years since quitting: 4.5   Smokeless tobacco: Never  Vaping Use   Vaping Use: Never used  Substance Use Topics   Alcohol use: Yes    Alcohol/week: 1.0 standard drink    Types: 1 Standard drinks or equivalent per  week    Comment: last use 02/05/21 q 2 mo   Drug use: Not Currently    Types: Marijuana    Comment: last use age 50     Review of Systems  Constitutional: No fever/chills Eyes:  No discharge ENT: No upper respiratory complaints. Respiratory: no cough. No SOB/ use of accessory muscles to breath Gastrointestinal:   No nausea, no vomiting.  No diarrhea.  No constipation. Genitourinary: Patient has vaginal itching.  Musculoskeletal: Negative for musculoskeletal pain. Skin: Negative for rash, abrasions, lacerations, ecchymosis.    ____________________________________________   PHYSICAL  EXAM:  VITAL SIGNS: ED Triage Vitals [04/12/21 1520]  Enc Vitals Group     BP 125/86     Pulse Rate 74     Resp 18     Temp 98.4 F (36.9 C)     Temp Source Oral     SpO2 97 %     Weight 250 lb (113.4 kg)     Height 5\' 5"  (1.651 m)     Head Circumference      Peak Flow      Pain Score 0     Pain Loc      Pain Edu?      Excl. in GC?      Constitutional: Alert and oriented. Well appearing and in no acute distress. Eyes: Conjunctivae are normal. PERRL. EOMI. Head: Atraumatic. ENT: Cardiovascular: Normal rate, regular rhythm. Normal S1 and S2.  Good peripheral circulation. Respiratory: Normal respiratory effort without tachypnea or retractions. Lungs CTAB. Good air entry to the bases with no decreased or absent breath sounds Gastrointestinal: Bowel sounds x 4 quadrants. Soft and nontender to palpation. No guarding or rigidity. No distention. Musculoskeletal: Full range of motion to all extremities. No obvious deformities noted Neurologic:  Normal for age. No gross focal neurologic deficits are appreciated.  Skin:  Skin is warm, dry and intact. No rash noted. Psychiatric: Mood and affect are normal for age. Speech and behavior are normal.   ____________________________________________   LABS (all labs ordered are listed, but only abnormal results are displayed)  Labs Reviewed  WET PREP, GENITAL - Abnormal; Notable for the following components:      Result Value   Yeast Wet Prep HPF POC PRESENT (*)    WBC, Wet Prep HPF POC FEW (*)    All other components within normal limits  URINALYSIS, ROUTINE W REFLEX MICROSCOPIC - Abnormal; Notable for the following components:   Color, Urine YELLOW (*)    APPearance HAZY (*)    Leukocytes,Ua TRACE (*)    Bacteria, UA FEW (*)    All other components within normal limits  CHLAMYDIA/NGC RT PCR ([facility] ONLY)            POC URINE PREG, ED    ____________________________________________  EKG   ____________________________________________  RADIOLOGY   No results found.  ____________________________________________    PROCEDURES  Procedure(s) performed:     Procedures     Medications - No data to display   ____________________________________________   INITIAL IMPRESSION / ASSESSMENT AND PLAN / ED COURSE  Pertinent labs & imaging results that were available during my care of the patient were reviewed by me and considered in my medical decision making (see chart for details).      Assessment and plan:  Vaginal Itching:  35 year old female presents to the emergency department with vaginal itching for the past 2 days.  She tested positive for yeast and will treat with Diflucan.  ____________________________________________  FINAL CLINICAL IMPRESSION(S) / ED DIAGNOSES  Final diagnoses:  Yeast vaginitis      NEW MEDICATIONS STARTED DURING THIS VISIT:  ED Discharge Orders          Ordered    fluconazole (DIFLUCAN) 150 MG tablet  Daily        04/12/21 1704    hydrOXYzine (ATARAX/VISTARIL) 25 MG tablet  Every 8 hours PRN        04/12/21 1704                This chart was dictated using voice recognition software/Dragon. Despite best efforts to proofread, errors can occur which can change the meaning. Any change was purely unintentional.     Orvil Feil, PA-C 04/12/21 1856    Delton Prairie, MD 04/15/21 [phone]

## 2021-04-13 LAB — POC URINE PREG, ED: Preg Test, Ur: NEGATIVE

## 2021-04-16 ENCOUNTER — Other Ambulatory Visit: Payer: Self-pay

## 2021-04-16 ENCOUNTER — Emergency Department
Admission: EM | Admit: 2021-04-16 | Discharge: 2021-04-16 | Disposition: A | Discharge status: Home / Self Care | Payer: Self-pay | Attending: Emergency Medicine | Admitting: Emergency Medicine

## 2021-04-16 DIAGNOSIS — N898 Other specified noninflammatory disorders of vagina: Secondary | ICD-10-CM | POA: Insufficient documentation

## 2021-04-16 DIAGNOSIS — Z87891 Personal history of nicotine dependence: Secondary | ICD-10-CM | POA: Insufficient documentation

## 2021-04-16 DIAGNOSIS — M545 Low back pain, unspecified: Secondary | ICD-10-CM | POA: Insufficient documentation

## 2021-04-16 LAB — URINALYSIS, COMPLETE (UACMP) WITH MICROSCOPIC
Bilirubin Urine: NEGATIVE
Glucose, UA: NEGATIVE mg/dL
Hgb urine dipstick: NEGATIVE
Ketones, ur: NEGATIVE mg/dL
Leukocytes,Ua: NEGATIVE
Nitrite: NEGATIVE
Protein, ur: NEGATIVE mg/dL
Specific Gravity, Urine: 1.025 (ref 1.005–1.030)
pH: 7 (ref 5.0–8.0)

## 2021-04-16 LAB — BASIC METABOLIC PANEL
Anion gap: 6 (ref 5–15)
BUN: 15 mg/dL (ref 6–20)
CO2: 27 mmol/L (ref 22–32)
Calcium: 8.5 mg/dL — ABNORMAL LOW (ref 8.9–10.3)
Chloride: 103 mmol/L (ref 98–111)
Creatinine, Ser: 0.86 mg/dL (ref 0.44–1.00)
GFR, Estimated: >60 mL/min (ref >60)
Glucose, Bld: 116 mg/dL — ABNORMAL HIGH (ref 70–99)
Potassium: 4.3 mmol/L (ref 3.5–5.1)
Sodium: 136 mmol/L (ref 135–145)

## 2021-04-16 LAB — WET PREP, GENITAL
Clue Cells Wet Prep HPF POC: NONE SEEN
Sperm: NONE SEEN
Trich, Wet Prep: NONE SEEN
Yeast Wet Prep HPF POC: NONE SEEN

## 2021-04-16 LAB — CBC WITH DIFFERENTIAL/PLATELET
Abs Immature Granulocytes: 0.04 10*3/uL (ref 0.00–0.07)
Basophils Absolute: 0.1 10*3/uL (ref 0.0–0.1)
Basophils Relative: 1 %
Eosinophils Absolute: 0.3 10*3/uL (ref 0.0–0.5)
Eosinophils Relative: 2 %
HCT: 41.3 % (ref 36.0–46.0)
Hemoglobin: 14.5 g/dL (ref 12.0–15.0)
Immature Granulocytes: 0 %
Lymphocytes Relative: 24 %
Lymphs Abs: 3.1 10*3/uL (ref 0.7–4.0)
MCH: 30.6 pg (ref 26.0–34.0)
MCHC: 35.1 g/dL (ref 30.0–36.0)
MCV: 87.1 fL (ref 80.0–100.0)
Monocytes Absolute: 0.7 10*3/uL (ref 0.1–1.0)
Monocytes Relative: 6 %
Neutro Abs: 8.4 10*3/uL — ABNORMAL HIGH (ref 1.7–7.7)
Neutrophils Relative %: 67 %
Platelets: 225 10*3/uL (ref 150–400)
RBC: 4.74 MIL/uL (ref 3.87–5.11)
RDW: 13.1 % (ref 11.5–15.5)
WBC: 12.6 10*3/uL — ABNORMAL HIGH (ref 4.0–10.5)
nRBC: 0 % (ref 0.0–0.2)

## 2021-04-16 LAB — CHLAMYDIA/NGC RT PCR (ARMC ONLY)
Chlamydia Tr: NOT DETECTED
N gonorrhoeae: NOT DETECTED

## 2021-04-16 MED ORDER — METRONIDAZOLE 500 MG PO TABS: 500.0000 mg | ORAL_TABLET | Freq: Two times a day (BID) | ORAL | 0 refills | Status: AC

## 2021-04-16 MED ORDER — METRONIDAZOLE 500 MG PO TABS
500.0000 mg | ORAL_TABLET | Freq: Once | ORAL | Status: AC
  Administered 2021-04-16: 500 mg via ORAL
  Filled 2021-04-16: qty 1

## 2021-04-16 NOTE — ED Triage Notes (Signed)
C/o increased vaginal discharge, and RLQ back pain today. Pt. Was seen in ED 3 days ago, was diagnosed with yeast infection, given antifungal script. Denies fever, n/v/d. No pain meds today.

## 2021-04-16 NOTE — ED Provider Notes (Signed)
[facility] Emergency Department Provider Note ____________________________________________   Event Date/Time   First MD Initiated Contact with Patient 04/16/21 [phone]     (approximate)  I have reviewed the triage vital signs and the nursing notes.   HISTORY  Chief Complaint Vaginal Discharge (C/o increased vaginal discharge, and RLQ back pain today. Pt. Was seen in ED 3 days ago, was diagnosed with yeast infection, given antifungal script. Denies fever, n/v/d.)    HPI Kimberly Arroyo is a 35 y.o. female with PMH as noted below who presents with vaginal discharge for the last several days, described as a whitish discharge like an egg white, persistent course, and associated with right lower back pain.  The patient states she was seen here in the ED several days ago for vaginal itching and was diagnosed with a yeast infection.  She was treated and states that the itching has resolved, but the other symptoms worsen.  She denies any abdominal pain, fever chills, vomiting, or other acute symptoms.  She denies any dysuria or hematuria.  She is sexually active with 1 partner and has no STD history.  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 Authorizing Provider  metroNIDAZOLE (FLAGYL) 500 MG tablet Take 1 tablet (500 mg total) by mouth 2 (two) times daily for 7 days. 04/16/21 04/23/21 Yes Dionne Bucy, MD  benzonatate (TESSALON PERLES) 100 MG capsule  Take 1 capsule (100 mg total) by mouth 3 (three) times daily as needed for cough. Patient not taking: No sig reported 12/28/20 12/28/21  Lucy Chris, PA  cyclobenzaprine (FLEXERIL) 5 MG tablet Take 1-2 tablets (5-10 mg total) by mouth 3 (three) times daily as needed for muscle spasms. Patient not taking: No sig reported 01/29/20   Evon Slack, PA-C  hydrOXYzine (ATARAX/VISTARIL) 25 MG tablet Take 1 tablet (25 mg total) by mouth every 8 (eight) hours as needed for up to 5 days. 04/12/21 04/17/21  Orvil Feil, PA-C  naproxen (NAPROSYN) 500 MG tablet Take 1 tablet (500 mg total) by mouth 2 (two) times daily with a meal. Patient not taking: No sig reported 01/29/20   Evon Slack, PA-C  ondansetron (ZOFRAN ODT) 4 MG disintegrating tablet Take 1 tablet (4 mg total) by mouth every 8 (eight) hours as needed for nausea or vomiting. Patient not taking: No sig reported 08/09/20   Arnaldo Natal, MD  oxyCODONE-acetaminophen (PERCOCET/ROXICET) 5-325 MG tablet Take 1 tablet by mouth every 4 (four) hours as needed for severe pain. Patient not taking: No sig reported 05/22/20   Irean Hong, MD    Allergies Patient has no known allergies.  Family History  Problem Relation Age of Onset   Lung cancer Maternal Grandmother    Uterine cancer Maternal Grandfather 25       pt has contact    Social History Social History   Tobacco Use   Smoking status: Former    Packs/day: 0.00  Types: Cigarettes, Cigars    Quit date: 09/25/2016    Years since quitting: 4.5   Smokeless tobacco: Never  Vaping Use   Vaping Use: Never used  Substance Use Topics   Alcohol use: Yes    Alcohol/week: 1.0 standard drink    Types: 1 Standard drinks or equivalent per week    Comment: last use 02/05/21 q 2 mo   Drug use: Not Currently    Types: Marijuana    Comment: last use age 29    Review of Systems  Constitutional: No fever/chills Eyes: No visual changes. ENT: No sore throat. Cardiovascular: Denies  chest pain. Respiratory: Denies shortness of breath. Gastrointestinal: No vomiting or diarrhea.  Genitourinary: Negative for dysuria.  Positive for vaginal discharge. Musculoskeletal: Positive for back pain. Skin: Negative for rash. Neurological: Negative for headaches, focal weakness or numbness.   ____________________________________________   PHYSICAL EXAM:  VITAL SIGNS: ED Triage Vitals  Enc Vitals Group     BP 04/16/21 0055 103/75     Pulse Rate 04/16/21 0055 93     Resp 04/16/21 0055 18     Temp 04/16/21 0055 98.2 F (36.8 C)     Temp Source 04/16/21 0055 Oral     SpO2 04/16/21 0055 97 %     Weight 04/16/21 0056 250 lb (113.4 kg)     Height 04/16/21 0056 5\' 5"  (1.651 m)     Head Circumference --      Peak Flow --      Pain Score 04/16/21 0056 6     Pain Loc --      Pain Edu? --      Excl. in GC? --     Constitutional: Alert and oriented. Well appearing and in no acute distress. Eyes: Conjunctivae are normal.  Head: Atraumatic. Nose: No congestion/rhinnorhea. Mouth/Throat: Mucous membranes are moist.   Neck: Normal range of motion.  Cardiovascular: Normal rate, regular rhythm. Good peripheral circulation. Respiratory: Normal respiratory effort.  No retractions.  Gastrointestinal: Soft and nontender. No distention.  Genitourinary: No CVA tenderness.  Normal external genitalia.  No CMT or adnexal tenderness.  Whitish liquid discharge. Musculoskeletal: No lower extremity edema.  Extremities warm and well perfused.  Neurologic:  Normal speech and language. No gross focal neurologic deficits are appreciated.  Skin:  Skin is warm and dry. No rash noted. Psychiatric: Mood and affect are normal. Speech and behavior are normal.  ____________________________________________   LABS (all labs ordered are listed, but only abnormal results are displayed)  Labs Reviewed  WET PREP, GENITAL - Abnormal; Notable for the following components:      Result Value   WBC, Wet  Prep HPF POC FEW (*)    All other components within normal limits  URINALYSIS, COMPLETE (UACMP) WITH MICROSCOPIC - Abnormal; Notable for the following components:   Color, Urine YELLOW (*)    APPearance CLEAR (*)    Bacteria, UA RARE (*)    All other components within normal limits  CBC WITH DIFFERENTIAL/PLATELET - Abnormal; Notable for the following components:   WBC 12.6 (*)    Neutro Abs 8.4 (*)    All other components within normal limits  BASIC METABOLIC PANEL - Abnormal; Notable for the following components:   Glucose, Bld 116 (*)    Calcium 8.5 (*)    All other components within normal limits  CHLAMYDIA/NGC RT PCR ([facility] ONLY)             ____________________________________________  EKG   ____________________________________________  RADIOLOGY    ____________________________________________   PROCEDURES  Procedure(s) performed: No  Procedures  Critical Care performed: No ____________________________________________   INITIAL IMPRESSION / ASSESSMENT AND PLAN / ED COURSE  Pertinent labs & imaging results that were available during my care of the patient were reviewed by me and considered in my medical decision making (see chart for details).   35 year old female with PMH as noted above presents with vaginal discharge over the last several days associated with some right lower back pain, but no vaginal bleeding, urinary symptoms, abdominal pain, or fever.  I reviewed the past medical records in Epic; the patient was seen in the ED on 830 with vaginal itching and diagnosed with a yeast infection.  She was discharged on fluconazole.  She reports that the itching has improved but the other symptoms have worsened.  On exam the patient is well-appearing.  Her vital signs are normal.  The abdomen is soft and nontender.  Vaginal exam reveals a whitish discharge which appears more liquid than would typically be seen with a yeast infection.  There is no CMT or adnexal  tenderness.  Differential includes continued yeast infection, BV, trichomonas.  I have a low suspicion for STI given the patient's low risk sexual history.  Urinalysis is negative and there is no evidence of UTI or pyelonephritis.  The right lower back pain appears musculoskeletal in etiology.  We will obtain a wet prep and GC/CT swab.  ----------------------------------------- 3:46 AM on 04/16/2021 -----------------------------------------  Wet prep is negative except for some WBCs.  The yeast appears to have successfully been treated.  The patient has had multiple episodes of BV in the past and although there are no clue cells, overall presentation is most consistent with this.  We will treat empirically with Flagyl.  The patient agrees with this plan.  She is stable for discharge home.  Return precautions given, she expresses understanding  ____________________________________________   FINAL CLINICAL IMPRESSION(S) / ED DIAGNOSES  Final diagnoses:  Vaginal discharge      NEW MEDICATIONS STARTED DURING THIS VISIT:  New Prescriptions   METRONIDAZOLE (FLAGYL) 500 MG TABLET    Take 1 tablet (500 mg total) by mouth 2 (two) times daily for 7 days.     Note:  This document was prepared using Dragon voice recognition software and may include unintentional dictation errors.    Dionne Bucy, MD 04/16/21 [phone]

## 2021-04-16 NOTE — ED Notes (Signed)
Care transferred, report received from [place], [place]

## 2021-04-16 NOTE — Discharge Instructions (Addendum)
Based on your symptoms, we believe you may have bacterial vaginosis.  Take the antibiotic as prescribed and finish the full course.  Return to the ER for new, worsening, or persistent discharge, bleeding, abdominal or back pain, fever, difficulty urinating, or any other new or worsening symptoms.  We have provided her referral information for one of our gynecologist for follow-up.

## 2021-05-05 ENCOUNTER — Ambulatory Visit: Payer: Self-pay | Admitting: Advanced Practice Midwife

## 2021-05-05 ENCOUNTER — Other Ambulatory Visit: Payer: Self-pay

## 2021-05-05 ENCOUNTER — Encounter: Payer: Self-pay | Admitting: Advanced Practice Midwife

## 2021-05-05 DIAGNOSIS — Z113 Encounter for screening for infections with a predominantly sexual mode of transmission: Secondary | ICD-10-CM

## 2021-05-05 NOTE — Progress Notes (Signed)
[facility] Department STI clinic/screening visit  Subjective:  Kimberly Arroyo is a 35 y.o. SWF exsmoker G4P3 female being seen today for an STI screening visit. The patient reports they do not have symptoms.  Patient reports that they do not desire a pregnancy in the next year.   They reported they are not interested in discussing contraception today.  Patient's last menstrual period was 05/04/2021.   Patient has the following medical conditions:   Patient Active Problem List   Diagnosis Date Noted   Morbid obesity (HCC) 250 lbs 03/04/2021   Self-mutilation (cutter) 03/04/2021   Secondary amenorrhea 02/12/2019   Depression, major, single episode, moderate (HCC) 02/24/2016   Generalized anxiety disorder 02/24/2016   Involuntary commitment 02/24/2016    Chief Complaint  Patient presents with   SEXUALLY TRANSMITTED DISEASE    Screening    HPI  Patient reports just wants bloodwork today and no exam. Last sex 03/25/21 without condom; was with partner x 2 mo; 1 partner in last 3 mo. Last MJ 07/2020. Last cig 09/2020. Last cigar age 3. Last ETOH 12/2020 (1 mixed drink) "rarely". Asymptomatic.  Last HIV test per patient/review of record was 03/10/21 Patient reports last pap was 10/28/19 neg HPV neg  See flowsheet for further details and programmatic requirements.    The following portions of the patient's history were reviewed and updated as appropriate: allergies, current medications, past medical history, past social history, past surgical history and problem list.  Objective:  There were no vitals filed for this visit.  Physical Exam Vitals and nursing note reviewed.  Constitutional:      Appearance: Normal appearance. She is obese.  HENT:     Head: Normocephalic and atraumatic.     Mouth/Throat:     Mouth: Mucous membranes are moist.     Pharynx: Oropharynx is clear. No oropharyngeal exudate or posterior oropharyngeal erythema.  Eyes:     Conjunctiva/sclera:  Conjunctivae normal.  Pulmonary:     Effort: Pulmonary effort is normal.  Abdominal:     Palpations: There is no mass.     Tenderness: There is no abdominal tenderness. There is no rebound.     Comments: Pt refuses exam and just wants bloodwork today  Genitourinary:    Pubic Area: No rash or pubic lice.      Labia:        Right: No rash or lesion.        Left: No rash or lesion.      Vagina: No vaginal discharge, erythema, bleeding or lesions.     Cervix: No cervical motion tenderness, discharge, friability, lesion or erythema.  Lymphadenopathy:     Head:     Right side of head: No preauricular or posterior auricular adenopathy.     Left side of head: No preauricular or posterior auricular adenopathy.     Cervical: No cervical adenopathy.     Upper Body:     Right upper body: No supraclavicular or axillary adenopathy.     Left upper body: No supraclavicular or axillary adenopathy.     Lower Body: No right inguinal adenopathy. No left inguinal adenopathy.  Skin:    Findings: No rash.  Neurological:     Mental Status: She is alert and oriented to person, place, and time.     Assessment and Plan:  Kimberly Arroyo is a 35 y.o. female presenting to the [facility] Department for STI screening  1. Screening examination for venereal disease Pt just wants HIV  and syphillis today and refuses exam - [facility] - Syphilis Serology, [place] [facility]     No follow-ups on file.  No future appointments.  Alberteen Spindle, CNM

## 2021-08-01 ENCOUNTER — Emergency Department
Admission: EM | Admit: 2021-08-01 | Discharge: 2021-08-01 | Disposition: A | Discharge status: Home / Self Care | Payer: BC Managed Care – PPO | Attending: Emergency Medicine | Admitting: Emergency Medicine

## 2021-08-01 ENCOUNTER — Emergency Department: Payer: BC Managed Care – PPO

## 2021-08-01 ENCOUNTER — Other Ambulatory Visit: Payer: Self-pay

## 2021-08-01 DIAGNOSIS — Z20822 Contact with and (suspected) exposure to covid-19: Secondary | ICD-10-CM | POA: Diagnosis not present

## 2021-08-01 DIAGNOSIS — R052 Subacute cough: Secondary | ICD-10-CM

## 2021-08-01 DIAGNOSIS — G4710 Hypersomnia, unspecified: Secondary | ICD-10-CM | POA: Insufficient documentation

## 2021-08-01 DIAGNOSIS — Z87891 Personal history of nicotine dependence: Secondary | ICD-10-CM | POA: Diagnosis not present

## 2021-08-01 DIAGNOSIS — J45909 Unspecified asthma, uncomplicated: Secondary | ICD-10-CM | POA: Diagnosis not present

## 2021-08-01 DIAGNOSIS — G4719 Other hypersomnia: Secondary | ICD-10-CM

## 2021-08-01 LAB — RESP PANEL BY RT-PCR (FLU A&B, COVID) ARPGX2
Influenza A by PCR: NEGATIVE
Influenza B by PCR: NEGATIVE
SARS Coronavirus 2 by RT PCR: NEGATIVE

## 2021-08-01 NOTE — ED Triage Notes (Signed)
Pt states that she has had cough for aprox 1 month pt also has had inc in fatigue.

## 2021-08-01 NOTE — ED Provider Notes (Signed)
[facility] Emergency Department Provider Note   ____________________________________________   Event Date/Time   First MD Initiated Contact with Patient 08/01/21 0448     (approximate)  I have reviewed the triage vital signs and the nursing notes.   HISTORY  Chief Complaint Cough    HPI Kimberly Arroyo is a 35 y.o. female who presents for a cough  LOCATION: Chest DURATION: 1 month prior to arrival TIMING: Intermittent with waxing and waning periods SEVERITY: Severe QUALITY: Cough CONTEXT: Patient states that every year during this time she gets a chronic cough however this year she has had a cough intermittently for the last month MODIFYING FACTORS: Denies any exacerbating or relieving factors ASSOCIATED SYMPTOMS: Excessive daytime sleepiness   Per medical record review, patient has history of PCOS, depression, and tobacco abuse          Past Medical History:  Diagnosis Date   Depression    PCOS (polycystic ovarian syndrome)     Patient Active Problem List   Diagnosis Date Noted   Morbid obesity (HCC) 250 lbs 03/04/2021   Self-mutilation (cutter) 03/04/2021   Secondary amenorrhea 02/12/2019   Depression, major, single episode, moderate (HCC) 02/24/2016   Generalized anxiety disorder 02/24/2016   Involuntary commitment 02/24/2016    Past Surgical History:  Procedure Laterality Date   NO PAST SURGERIES      Prior to Admission medications   Medication Sig Start Date End Date Taking? Authorizing Provider  benzonatate (TESSALON PERLES) 100 MG capsule Take 1 capsule (100 mg total) by mouth 3 (three) times daily as needed for cough. Patient not taking: No sig reported 12/28/20 12/28/21  Lucy Chris, PA  cyclobenzaprine (FLEXERIL) 5 MG tablet Take 1-2 tablets (5-10 mg total) by mouth 3 (three) times daily as needed for muscle spasms. Patient not taking: No sig reported 01/29/20   Evon Slack, PA-C  naproxen (NAPROSYN) 500 MG  tablet Take 1 tablet (500 mg total) by mouth 2 (two) times daily with a meal. Patient not taking: No sig reported 01/29/20   Evon Slack, PA-C  ondansetron (ZOFRAN ODT) 4 MG disintegrating tablet Take 1 tablet (4 mg total) by mouth every 8 (eight) hours as needed for nausea or vomiting. Patient not taking: No sig reported 08/09/20   Arnaldo Natal, MD  oxyCODONE-acetaminophen (PERCOCET/ROXICET) 5-325 MG tablet Take 1 tablet by mouth every 4 (four) hours as needed for severe pain. Patient not taking: No sig reported 05/22/20   Irean Hong, MD    Allergies Patient has no known allergies.  Family History  Problem Relation Age of Onset   Lung cancer Maternal Grandmother    Uterine cancer Maternal Grandfather 28       pt has contact    Social History Social History   Tobacco Use   Smoking status: Former    Packs/day: 0.00    Types: Cigarettes, Cigars    Quit date: 09/25/2016    Years since quitting: 4.8   Smokeless tobacco: Never  Vaping Use   Vaping Use: Never used  Substance Use Topics   Alcohol use: Yes    Alcohol/week: 1.0 standard drink    Types: 1 Standard drinks or equivalent per week    Comment: last use 12/2020 "occassionally"   Drug use: Not Currently    Types: Marijuana    Comment: last use 07/2020    Review of Systems Constitutional: No fever/chills Eyes: No visual changes. ENT: Endorses sore throat and cough Cardiovascular: Denies  chest pain. Respiratory: Denies shortness of breath. Gastrointestinal: No abdominal pain.  No nausea, no vomiting.  No diarrhea. Genitourinary: Negative for dysuria. Musculoskeletal: Negative for acute arthralgias Skin: Negative for rash. Neurological: Negative for headaches, weakness/numbness/paresthesias in any extremity Psychiatric: Negative for suicidal ideation/homicidal ideation   ____________________________________________   PHYSICAL EXAM:  VITAL SIGNS: ED Triage Vitals  Enc Vitals Group     BP 08/01/21 0049  (!) 140/91     Pulse Rate 08/01/21 0049 99     Resp 08/01/21 0049 18     Temp 08/01/21 0049 98.1 F (36.7 C)     Temp Source 08/01/21 0049 Oral     SpO2 08/01/21 0049 97 %     Weight 08/01/21 0048 265 lb (120.2 kg)     Height 08/01/21 0048 5\' 5"  (1.651 m)     Head Circumference --      Peak Flow --      Pain Score 08/01/21 0053 0     Pain Loc --      Pain Edu? --      Excl. in GC? --    Constitutional: Alert and oriented. Well appearing and in no acute distress. Eyes: Conjunctivae are normal. PERRL. Head: Atraumatic. Nose: No congestion/rhinnorhea. Mouth/Throat: Mucous membranes are moist.  Slightly erythematous posterior oropharynx with prominent bilateral tonsils with the left greater than the right Neck: No stridor Cardiovascular: Grossly normal heart sounds.  Good peripheral circulation. Respiratory: Normal respiratory effort.  No retractions. Gastrointestinal: Soft and nontender. No distention. Musculoskeletal: No obvious deformities Neurologic:  Normal speech and language. No gross focal neurologic deficits are appreciated. Skin:  Skin is warm and dry. No rash noted. Psychiatric: Mood and affect are normal. Speech and behavior are normal.  ____________________________________________   LABS (all labs ordered are listed, but only abnormal results are displayed)  Labs Reviewed  RESP PANEL BY RT-PCR (FLU A&B, COVID) [id number]   ____________________________________________  RADIOLOGY  ED MD interpretation: 2 view chest x-ray shows no evidence of acute abnormalities including no pneumonia, pneumothorax, or widened mediastinum  Official radiology report(s): DG Chest 2 View  Result Date: 08/01/2021 CLINICAL DATA:  Cough and fatigue. EXAM: CHEST - 2 VIEW COMPARISON:  March 26, 2021 FINDINGS: The heart size and mediastinal contours are within normal limits. Both lungs are clear. The visualized skeletal structures are unremarkable. IMPRESSION: No active cardiopulmonary  disease. Electronically Signed   By: March 28, 2021 M.D.   On: 08/01/2021 01:15    ____________________________________________   PROCEDURES  Procedure(s) performed (including Critical Care):  .1-3 Lead EKG Interpretation Performed by: 08/03/2021, MD Authorized by: Merwyn Katos, MD     Interpretation: normal     ECG rate:  88   ECG rate assessment: normal     Rhythm: sinus rhythm     Ectopy: none     Conduction: normal     ____________________________________________   INITIAL IMPRESSION / ASSESSMENT AND PLAN / ED COURSE  As part of my medical decision making, I reviewed the following data within the electronic medical record, if available:  Nursing notes reviewed and incorporated, Labs reviewed, EKG interpreted, Old chart reviewed, Radiograph reviewed and Notes from prior ED visits reviewed and incorporated        Patient a 35 year old female who presents for a subacute cough that is productive of white/green sputum.  Differential diagnosis includes but is not limited to: Laryngeal mass, pneumonia, ACE inhibitor induced angioedema  Work-up: Negative PCR for flu/COVID 2 view chest x-ray shows  no evidence of acute abnormalities  Patient has history of asthma as well as tobacco abuse and presents for subacute cough.  Patient has no red flag symptomatology and work-up is negative at this time.  Encourage patient to follow-up with her primary care physician as well as ENT for likely tonsillectomy.  Patient also tells a concerning story for possible sleep apnea including daytime sleepiness and excessive snoring and therefore recommended patient to pursue a sleep study in the outpatient setting as well.  Patient expresses understanding and all pertinent questions were answered to the best my ability.  Dispo: Discharge home      ____________________________________________   FINAL CLINICAL IMPRESSION(S) / ED DIAGNOSES  Final diagnoses:  Subacute cough  Excessive  daytime sleepiness     ED Discharge Orders     None        Note:  This document was prepared using Dragon voice recognition software and may include unintentional dictation errors.    Merwyn Katos, MD 08/01/21 [phone]

## 2021-08-01 NOTE — Discharge Instructions (Addendum)
Please use cetirizine (Zyrtec) every day to improve your cough.  You may continue to use Sudafed and Robitussin

## 2022-01-29 ENCOUNTER — Emergency Department: Payer: BC Managed Care – PPO

## 2022-01-29 ENCOUNTER — Emergency Department
Admission: EM | Admit: 2022-01-29 | Discharge: 2022-01-29 | Disposition: A | Discharge status: Home / Self Care | Payer: BC Managed Care – PPO | Attending: Emergency Medicine | Admitting: Emergency Medicine

## 2022-01-29 ENCOUNTER — Other Ambulatory Visit: Payer: Self-pay

## 2022-01-29 DIAGNOSIS — S93401A Sprain of unspecified ligament of right ankle, initial encounter: Secondary | ICD-10-CM | POA: Diagnosis not present

## 2022-01-29 DIAGNOSIS — M25511 Pain in right shoulder: Secondary | ICD-10-CM | POA: Diagnosis not present

## 2022-01-29 DIAGNOSIS — M546 Pain in thoracic spine: Secondary | ICD-10-CM | POA: Diagnosis not present

## 2022-01-29 DIAGNOSIS — S99911A Unspecified injury of right ankle, initial encounter: Secondary | ICD-10-CM | POA: Diagnosis present

## 2022-01-29 DIAGNOSIS — Y92410 Unspecified street and highway as the place of occurrence of the external cause: Secondary | ICD-10-CM | POA: Insufficient documentation

## 2022-01-29 LAB — POC URINE PREG, ED: Preg Test, Ur: NEGATIVE
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
## 2022-01-29 NOTE — ED Notes (Signed)
Pt given urine cup for pregnancy test.

## 2022-01-29 NOTE — Discharge Instructions (Addendum)
You may alternate Tylenol 1000 mg every 6 hours as needed for pain, fever and Ibuprofen 800 mg every 6-8 hours as needed for pain, fever.  Please take Ibuprofen with food.  Do not take more than 4000 mg of Tylenol (acetaminophen) in a 24 hour period. ° °

## 2022-01-29 NOTE — ED Notes (Signed)
Xray notified preg test was neg.
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
## 2022-01-30 ENCOUNTER — Other Ambulatory Visit: Payer: Self-pay

## 2022-01-30 DIAGNOSIS — M5431 Sciatica, right side: Secondary | ICD-10-CM | POA: Insufficient documentation

## 2022-01-30 DIAGNOSIS — R252 Cramp and spasm: Secondary | ICD-10-CM | POA: Insufficient documentation
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
## 2022-01-31 ENCOUNTER — Encounter: Payer: Self-pay | Admitting: Radiology

## 2022-01-31 ENCOUNTER — Emergency Department
Admission: EM | Admit: 2022-01-31 | Discharge: 2022-01-31 | Disposition: A | Discharge status: Home / Self Care | Payer: Self-pay | Attending: Emergency Medicine | Admitting: Emergency Medicine

## 2022-01-31 ENCOUNTER — Emergency Department: Payer: Self-pay

## 2022-01-31 DIAGNOSIS — R252 Cramp and spasm: Secondary | ICD-10-CM

## 2022-01-31 DIAGNOSIS — M5431 Sciatica, right side: Secondary | ICD-10-CM

## 2022-01-31 LAB — BASIC METABOLIC PANEL
Anion gap: 3 — ABNORMAL LOW (ref 5–15)
BUN: 14 mg/dL (ref 6–20)
CO2: 25 mmol/L (ref 22–32)
Calcium: 8.5 mg/dL — ABNORMAL LOW (ref 8.9–10.3)
Chloride: 111 mmol/L (ref 98–111)
Creatinine, Ser: 0.75 mg/dL (ref 0.44–1.00)
GFR, Estimated: >60 mL/min (ref >60)
Glucose, Bld: 133 mg/dL — ABNORMAL HIGH (ref 70–99)
Potassium: 3.8 mmol/L (ref 3.5–5.1)
Sodium: 139 mmol/L (ref 135–145)

## 2022-01-31 LAB — CK: Total CK: 140 U/L (ref 38–234)

## 2022-01-31 MED ORDER — METHYLPREDNISOLONE 4 MG PO TBPK: ORAL_TABLET | ORAL | 0 refills | Status: DC

## 2022-01-31 MED ORDER — CYCLOBENZAPRINE HCL 5 MG PO TABS: ORAL_TABLET | ORAL | 0 refills | Status: DC

## 2022-01-31 MED ORDER — NAPROXEN 500 MG PO TABS: 500.0000 mg | ORAL_TABLET | Freq: Two times a day (BID) | ORAL | 0 refills | Status: DC

## 2022-01-31 NOTE — Discharge Instructions (Signed)
Start Medrol Dosepak and Naprosyn twice daily for back/hip pain.  You may take Flexeril as needed for muscle spasms.  Return to the ER for worsening symptoms, persistent vomiting, extremity weakness/numbness/tingling, bowel or bladder incontinence or other concerns.

## 2022-01-31 NOTE — ED Notes (Signed)
Pt to radiology via stretcher at this time.  

## 2022-01-31 NOTE — ED Notes (Signed)
Pt DC to home. DC instructions reviewed with all questions answered. Pt verbalizes understanding. IV removed, cath intact, pressure dressing applied. No bleeding noted at site. Pt ambulated out of dept with steady gait.
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
## 2022-03-31 ENCOUNTER — Encounter: Payer: Self-pay | Admitting: Emergency Medicine

## 2022-03-31 ENCOUNTER — Emergency Department
Admission: EM | Admit: 2022-03-31 | Discharge: 2022-04-01 | Disposition: A | Discharge status: Home / Self Care | Payer: Self-pay | Attending: Student in an Organized Health Care Education/Training Program | Admitting: Student in an Organized Health Care Education/Training Program

## 2022-03-31 ENCOUNTER — Emergency Department: Payer: Self-pay

## 2022-03-31 ENCOUNTER — Other Ambulatory Visit: Payer: Self-pay

## 2022-03-31 DIAGNOSIS — K529 Noninfective gastroenteritis and colitis, unspecified: Secondary | ICD-10-CM | POA: Insufficient documentation

## 2022-03-31 DIAGNOSIS — K5732 Diverticulitis of large intestine without perforation or abscess without bleeding: Secondary | ICD-10-CM | POA: Insufficient documentation

## 2022-03-31 DIAGNOSIS — R1011 Right upper quadrant pain: Secondary | ICD-10-CM

## 2022-03-31 DIAGNOSIS — K5792 Diverticulitis of intestine, part unspecified, without perforation or abscess without bleeding: Secondary | ICD-10-CM

## 2022-03-31 LAB — COMPREHENSIVE METABOLIC PANEL
ALT: 14 U/L (ref 0–44)
AST: 20 U/L (ref 15–41)
Albumin: 3 g/dL — ABNORMAL LOW (ref 3.5–5.0)
Alkaline Phosphatase: 47 U/L (ref 38–126)
Anion gap: 8 (ref 5–15)
BUN: 11 mg/dL (ref 6–20)
CO2: 20 mmol/L — ABNORMAL LOW (ref 22–32)
Calcium: 8 mg/dL — ABNORMAL LOW (ref 8.9–10.3)
Chloride: 110 mmol/L (ref 98–111)
Creatinine, Ser: 0.73 mg/dL (ref 0.44–1.00)
GFR, Estimated: >60 mL/min (ref >60)
Glucose, Bld: 130 mg/dL — ABNORMAL HIGH (ref 70–99)
Potassium: 3.8 mmol/L (ref 3.5–5.1)
Sodium: 138 mmol/L (ref 135–145)
Total Bilirubin: 0.9 mg/dL (ref 0.3–1.2)
Total Protein: 5.9 g/dL — ABNORMAL LOW (ref 6.5–8.1)

## 2022-03-31 LAB — URINALYSIS, ROUTINE W REFLEX MICROSCOPIC
Bilirubin Urine: NEGATIVE
Glucose, UA: NEGATIVE mg/dL
Ketones, ur: NEGATIVE mg/dL
Leukocytes,Ua: NEGATIVE
Nitrite: NEGATIVE
Protein, ur: NEGATIVE mg/dL
Specific Gravity, Urine: 1.027 (ref 1.005–1.030)
pH: 5 (ref 5.0–8.0)

## 2022-03-31 LAB — CBC
HCT: 45.4 % (ref 36.0–46.0)
Hemoglobin: 15.4 g/dL — ABNORMAL HIGH (ref 12.0–15.0)
MCH: 29.1 pg (ref 26.0–34.0)
MCHC: 33.9 g/dL (ref 30.0–36.0)
MCV: 85.7 fL (ref 80.0–100.0)
Platelets: 229 10*3/uL (ref 150–400)
RBC: 5.3 MIL/uL — ABNORMAL HIGH (ref 3.87–5.11)
RDW: 13.2 % (ref 11.5–15.5)
WBC: 17.5 10*3/uL — ABNORMAL HIGH (ref 4.0–10.5)
nRBC: 0 % (ref 0.0–0.2)

## 2022-03-31 LAB — LIPASE, BLOOD: Lipase: 24 U/L (ref 11–51)

## 2022-03-31 LAB — POC URINE PREG, ED: Preg Test, Ur: NEGATIVE

## 2022-03-31 MED ORDER — SODIUM CHLORIDE 0.9 % IV BOLUS
1000.0000 mL | Freq: Once | INTRAVENOUS | Status: AC
  Administered 2022-03-31: 1000 mL via INTRAVENOUS

## 2022-03-31 MED ORDER — IOHEXOL 300 MG/ML  SOLN
100.0000 mL | Freq: Once | INTRAMUSCULAR | Status: AC | PRN
Start: 2022-03-31 — End: 2022-03-31
  Administered 2022-03-31: 100 mL via INTRAVENOUS

## 2022-03-31 MED ORDER — ONDANSETRON HCL 4 MG/2ML IJ SOLN
4.0000 mg | Freq: Once | INTRAMUSCULAR | Status: AC
  Administered 2022-03-31: 4 mg via INTRAVENOUS
  Filled 2022-03-31: qty 2

## 2022-03-31 MED ORDER — MORPHINE SULFATE (PF) 4 MG/ML IV SOLN
4.0000 mg | INTRAVENOUS | Status: DC | PRN
  Administered 2022-03-31: 4 mg via INTRAVENOUS
  Filled 2022-03-31: qty 1

## 2022-03-31 NOTE — ED Triage Notes (Signed)
Pt to ED via POV c/o abdominal pain x 2 days. Pt states that the pain is worse after eating. Pt denies vomiting but states that she is very nauseated.

## 2022-03-31 NOTE — ED Provider Notes (Signed)
[facility] Provider Note    Event Date/Time   First MD Initiated Contact with Patient 03/31/22 2130     (approximate)   History   Abdominal Pain   HPI  Kimberly Arroyo is a 36 y.o. female no significant past medical history presents to the ER for evaluation of generalized abdominal pain particular on the right side both upper and lower for the past 2 days worsened with eating.  No pain radiating to her back.  Denies any vaginal discharge or dysuria.  Has never had pain like this before.  Is having some chills no measured temperature.  No recent antibiotics.  No previous surgeries.     Physical Exam   Triage Vital Signs: ED Triage Vitals  Enc Vitals Group     BP 03/31/22 1628 115/68     Pulse Rate 03/31/22 1628 96     Resp 03/31/22 1628 16     Temp 03/31/22 1628 99 F (37.2 C)     Temp Source 03/31/22 1628 Oral     SpO2 03/31/22 1628 97 %     Weight --      Height --      Head Circumference --      Peak Flow --      Pain Score 03/31/22 1625 10     Pain Loc --      Pain Edu? --      Excl. in GC? --     Most recent vital signs: Vitals:   03/31/22 1628 03/31/22 2325  BP: 115/68 (!) 141/58  Pulse: 96 86  Resp: 16 16  Temp: 99 F (37.2 C) 99.3 F (37.4 C)  SpO2: 97% 99%     Constitutional: Alert  Eyes: Conjunctivae are normal.  Head: Atraumatic. Nose: No congestion/rhinnorhea. Mouth/Throat: Mucous membranes are moist.   Neck: Painless ROM.  Cardiovascular:   Good peripheral circulation. Respiratory: Normal respiratory effort.  No retractions.  Gastrointestinal: Soft mild tenderness to palpation in both epigastric right upper quadrant and right lower quadrant Musculoskeletal:  no deformity Neurologic:  MAE spontaneously. No gross focal neurologic deficits are appreciated.  Skin:  Skin is warm, dry and intact. No rash noted. Psychiatric: Mood and affect are normal. Speech and behavior are normal.    ED Results / Procedures /  Treatments   Labs (all labs ordered are listed, but only abnormal results are displayed) Labs Reviewed  COMPREHENSIVE METABOLIC PANEL - Abnormal; Notable for the following components:      Result Value   CO2 20 (*)    Glucose, Bld 130 (*)    Calcium 8.0 (*)    Total Protein 5.9 (*)    Albumin 3.0 (*)    All other components within normal limits  CBC - Abnormal; Notable for the following components:   WBC 17.5 (*)    RBC 5.30 (*)    Hemoglobin 15.4 (*)    All other components within normal limits  URINALYSIS, ROUTINE W REFLEX MICROSCOPIC - Abnormal; Notable for the following components:   Color, Urine YELLOW (*)    APPearance HAZY (*)    Hgb urine dipstick LARGE (*)    Bacteria, UA FEW (*)    All other components within normal limits  LIPASE, BLOOD  POC URINE PREG, ED     EKG     RADIOLOGY Please see ED Course for my review and interpretation.  I personally reviewed all radiographic images ordered to evaluate for the above acute complaints and reviewed  radiology reports and findings.  These findings were personally discussed with the patient.  Please see medical record for radiology report.    PROCEDURES:  Critical Care performed: No  Procedures   MEDICATIONS ORDERED IN ED: Medications  morphine (PF) 4 MG/ML injection 4 mg (4 mg Intravenous Given 03/31/22 2334)  sodium chloride 0.9 % bolus 1,000 mL (1,000 mLs Intravenous New Bag/Given 03/31/22 2332)  ondansetron (ZOFRAN) injection 4 mg (4 mg Intravenous Given 03/31/22 2334)  iohexol (OMNIPAQUE) 300 MG/ML solution 100 mL (100 mLs Intravenous Contrast Given 03/31/22 2352)     IMPRESSION / MDM / ASSESSMENT AND PLAN / ED COURSE  I reviewed the triage vital signs and the nursing notes.                              Differential diagnosis includes, but is not limited to, discitis, colitis, diverticulitis, pyelonephritis, biliary pathology, gastritis, enteritis  Presented to the ER for evaluation of symptoms as  described above. This presenting complaint could reflect a potentially life-threatening illness therefore the patient will be placed on continuous pulse oximetry and telemetry for monitoring.  Laboratory evaluation will be sent to evaluate for the above complaints.  based on her presentation pain will order CT imaging will order IV fluids as well as IV morphine for pain.  She will be signed out to oncoming physician pending follow-up imaging and reassessment.         FINAL CLINICAL IMPRESSION(S) / ED DIAGNOSES   Final diagnoses:  Right upper quadrant abdominal pain     Rx / DC Orders   ED Discharge Orders     None        Note:  This document was prepared using Dragon voice recognition software and may include unintentional dictation errors.    Willy Eddy, MD 04/01/22 [phone]

## 2022-04-01 MED ORDER — ONDANSETRON 4 MG PO TBDP: 4.0000 mg | ORAL_TABLET | Freq: Three times a day (TID) | ORAL | 0 refills | Status: DC | PRN

## 2022-04-01 MED ORDER — PIPERACILLIN-TAZOBACTAM 3.375 G IVPB 30 MIN
3.3750 g | Freq: Once | INTRAVENOUS | Status: AC
  Administered 2022-04-01: 3.375 g via INTRAVENOUS
  Filled 2022-04-01: qty 50

## 2022-04-01 MED ORDER — AMOXICILLIN-POT CLAVULANATE 875-125 MG PO TABS
1.0000 | ORAL_TABLET | Freq: Two times a day (BID) | ORAL | 0 refills | Status: DC
Start: 2022-04-01 — End: 2024-04-04

## 2022-04-01 MED ORDER — OXYCODONE-ACETAMINOPHEN 5-325 MG PO TABS: 1.0000 | ORAL_TABLET | ORAL | 0 refills | Status: DC | PRN

## 2022-04-01 MED ORDER — OXYCODONE-ACETAMINOPHEN 5-325 MG PO TABS
1.0000 | ORAL_TABLET | Freq: Once | ORAL | Status: AC
  Administered 2022-04-01: 1 via ORAL
  Filled 2022-04-01: qty 1

## 2022-04-01 NOTE — Discharge Instructions (Signed)
1.  Take antibiotic as prescribed (Augmentin 875mg  twice daily x7 days). 2.  You may take medicines as needed for pain and nausea (Percocet/Zofran). 3.  Return to the ER for worsening symptoms, persistent vomiting, difficulty breathing or other concerns.

## 2022-04-01 NOTE — ED Provider Notes (Signed)
-----------------------------------------   12:57 AM on 04/01/2022 -----------------------------------------   CT abd/pel:   Findings consistent with marked severity colitis and diverticulitis  involving the cecum and proximal ascending colon.   Updated patient on CT results.  Pain is improved; no complaints of nausea.  Feel patient is appropriate for outpatient treatment.  Will administer IV Zosyn now and discharge on oral Augmentin.  Will provide as needed prescriptions for Percocet and Zofran.  Patient will follow-up with her PCP closely.  Strict return precautions given.  Patient verbalizes understanding agrees with plan of care.   Irean Hong, MD 04/01/22 0800

## 2023-03-05 ENCOUNTER — Other Ambulatory Visit: Payer: Self-pay

## 2023-03-05 ENCOUNTER — Encounter: Payer: Self-pay | Admitting: Emergency Medicine

## 2023-03-05 DIAGNOSIS — N898 Other specified noninflammatory disorders of vagina: Secondary | ICD-10-CM | POA: Diagnosis not present

## 2023-03-05 DIAGNOSIS — M7989 Other specified soft tissue disorders: Secondary | ICD-10-CM | POA: Diagnosis not present

## 2023-03-05 DIAGNOSIS — D72829 Elevated white blood cell count, unspecified: Secondary | ICD-10-CM | POA: Insufficient documentation

## 2023-03-05 DIAGNOSIS — R350 Frequency of micturition: Secondary | ICD-10-CM | POA: Diagnosis not present

## 2023-03-05 LAB — URINALYSIS, ROUTINE W REFLEX MICROSCOPIC
Bilirubin Urine: NEGATIVE
Glucose, UA: NEGATIVE mg/dL
Hgb urine dipstick: NEGATIVE
Ketones, ur: NEGATIVE mg/dL
Leukocytes,Ua: NEGATIVE
Nitrite: NEGATIVE
Protein, ur: NEGATIVE mg/dL
Specific Gravity, Urine: 1.023 (ref 1.005–1.030)
pH: 6 (ref 5.0–8.0)

## 2023-03-05 LAB — CBC
HCT: 43.6 % (ref 36.0–46.0)
Hemoglobin: 14.6 g/dL (ref 12.0–15.0)
MCH: 28.8 pg (ref 26.0–34.0)
MCHC: 33.5 g/dL (ref 30.0–36.0)
MCV: 86 fL (ref 80.0–100.0)
Platelets: 214 10*3/uL (ref 150–400)
RBC: 5.07 MIL/uL (ref 3.87–5.11)
RDW: 13.5 % (ref 11.5–15.5)
WBC: 13.7 10*3/uL — ABNORMAL HIGH (ref 4.0–10.5)
nRBC: 0 % (ref 0.0–0.2)

## 2023-03-05 LAB — POC URINE PREG, ED: Preg Test, Ur: NEGATIVE

## 2023-03-05 NOTE — ED Triage Notes (Signed)
Patient ambulatory to triage with steady gait, without difficulty or distress noted; pt reports last 3-4 days having urinary frequency with vag itching as well as leg/feet swelling

## 2023-03-06 ENCOUNTER — Emergency Department
Admission: EM | Admit: 2023-03-06 | Discharge: 2023-03-06 | Disposition: A | Discharge status: Home / Self Care | Payer: Medicaid Other | Attending: Emergency Medicine | Admitting: Emergency Medicine

## 2023-03-06 DIAGNOSIS — R350 Frequency of micturition: Secondary | ICD-10-CM

## 2023-03-06 DIAGNOSIS — M7989 Other specified soft tissue disorders: Secondary | ICD-10-CM

## 2023-03-06 DIAGNOSIS — N898 Other specified noninflammatory disorders of vagina: Secondary | ICD-10-CM

## 2023-03-06 LAB — BASIC METABOLIC PANEL
Anion gap: 7 (ref 5–15)
BUN: 14 mg/dL (ref 6–20)
CO2: 22 mmol/L (ref 22–32)
Calcium: 8.3 mg/dL — ABNORMAL LOW (ref 8.9–10.3)
Chloride: 107 mmol/L (ref 98–111)
Creatinine, Ser: 0.73 mg/dL (ref 0.44–1.00)
GFR, Estimated: >60 mL/min (ref >60)
Glucose, Bld: 91 mg/dL (ref 70–99)
Potassium: 3.4 mmol/L — ABNORMAL LOW (ref 3.5–5.1)
Sodium: 136 mmol/L (ref 135–145)

## 2023-03-06 LAB — CHLAMYDIA/NGC RT PCR (ARMC ONLY)
Chlamydia Tr: NOT DETECTED
N gonorrhoeae: NOT DETECTED

## 2023-03-06 LAB — WET PREP, GENITAL
Clue Cells Wet Prep HPF POC: NONE SEEN
Sperm: NONE SEEN
Trich, Wet Prep: NONE SEEN
WBC, Wet Prep HPF POC: >=10 — AB (ref <10)
Yeast Wet Prep HPF POC: NONE SEEN

## 2023-03-06 NOTE — Discharge Instructions (Addendum)
Please take Tylenol and ibuprofen/Advil for your pain.  It is safe to take them together, or to alternate them every few hours.  Take up to 1000mg of Tylenol at a time, up to 4 times per day.  Do not take more than 4000 mg of Tylenol in 24 hours.  For ibuprofen, take 400-600 mg, 3 - 4 times per day.  

## 2023-03-06 NOTE — ED Provider Notes (Signed)
[facility] Provider Note    Event Date/Time   First MD Initiated Contact with Patient 03/06/23 [phone]     (approximate)   History   Leg Swelling   HPI  Kimberly Arroyo is a 37 y.o. female who presents to the ED for evaluation of Leg Swelling   Morbidly obese patient presents to the ED for the evaluation of vaginal itching, urinary frequency, new vaginal discharge.   Reports that her legs often intermittently swell, but they seem to be more swollen for the past couple days with recent hot weather.  Denies any trauma or injuries, rash or other skin changes to her legs.  No shortness of breath, orthopnea, known liver disease or CHF.  Not taking any exogenous estrogen or OCPs.  No history of DVT.   Physical Exam   Triage Vital Signs: ED Triage Vitals  Encounter Vitals Group     BP 03/05/23 2306 (!) 140/71     Systolic BP Percentile --      Diastolic BP Percentile --      Pulse Rate 03/05/23 2306 70     Resp 03/05/23 2306 17     Temp 03/05/23 2306 98.2 F (36.8 C)     Temp Source 03/05/23 2306 Oral     SpO2 03/05/23 2306 97 %     Weight 03/05/23 2302 270 lb (122.5 kg)     Height 03/05/23 2302 5\' 5"  (1.651 m)     Head Circumference --      Peak Flow --      Pain Score 03/05/23 2302 6     Pain Loc --      Pain Education --      Exclude from Growth Chart --     Most recent vital signs: Vitals:   03/05/23 2306  BP: (!) 140/71  Pulse: 70  Resp: 17  Temp: 98.2 F (36.8 C)  SpO2: 97%    General: Awake, no distress.  Morbidly obese, well-appearing, pleasant and conversational CV:  Good peripheral perfusion.  Resp:  Normal effort.  Abd:  No distention.  Minimal suprapubic tenderness without peritoneal features.  Abdomen is otherwise benign. MSK:  No deformity noted.  Trace pitting edema noted to bilateral lower extremities symmetrically without overlying skin changes, signs of trauma, bruising or erythema.  No calf tenderness bilaterally.  Strong  DP pulses symmetrically bilaterally. Neuro:  No focal deficits appreciated. Other:     ED Results / Procedures / Treatments   Labs (all labs ordered are listed, but only abnormal results are displayed) Labs Reviewed  WET PREP, GENITAL - Abnormal; Notable for the following components:      Result Value   WBC, Wet Prep HPF POC >=10 (*)    All other components within normal limits  URINALYSIS, ROUTINE W REFLEX MICROSCOPIC - Abnormal; Notable for the following components:   Color, Urine YELLOW (*)    APPearance CLEAR (*)    All other components within normal limits  CBC - Abnormal; Notable for the following components:   WBC 13.7 (*)    All other components within normal limits  BASIC METABOLIC PANEL - Abnormal; Notable for the following components:   Potassium 3.4 (*)    Calcium 8.3 (*)    All other components within normal limits  CHLAMYDIA/NGC RT PCR ([facility] ONLY)            URINE CULTURE  POC URINE PREG, ED    EKG   RADIOLOGY   Official  radiology report(s): No results found.  PROCEDURES and INTERVENTIONS:  Procedures  Medications - No data to display   IMPRESSION / MDM / ASSESSMENT AND PLAN / ED COURSE  I reviewed the triage vital signs and the nursing notes.  Differential diagnosis includes, but is not limited to, gonorrhea, chlamydia, BV, trichomoniasis, DVT, lymphedema, cellulitis  {Patient presents with symptoms of an acute illness or injury that is potentially life-threatening.  During my initial evaluation of the patient, we discussed her leg swelling, DVT and venous ultrasounds of the lower extremities.  After discussing the risks and benefits, she would prefer not to get the ultrasound done today.  Workup returned quite benign with a clear urine, negative wet prep and GC.  Normal metabolic panel and CBC with marginal leukocytosis.  We discussed close PCP follow-up and return precautions.  She is suitable for outpatient management  Clinical Course as of  03/06/23 0520  Tue Mar 06, 2023  1308 Reassessed and discussed reassuring workup overall.  Discussed PCP follow-up and return precautions [DS]    Clinical Course User Index [DS] Delton Prairie, MD     FINAL CLINICAL IMPRESSION(S) / ED DIAGNOSES   Final diagnoses:  Leg swelling  Vaginal discharge  Urinary frequency     Rx / DC Orders   ED Discharge Orders     None        Note:  This document was prepared using Dragon voice recognition software and may include unintentional dictation errors.   Delton Prairie, MD 03/06/23 [phone]

## 2023-03-07 LAB — URINE CULTURE

## 2023-03-24 ENCOUNTER — Emergency Department: Payer: Medicaid Other

## 2023-03-24 ENCOUNTER — Other Ambulatory Visit: Payer: Self-pay

## 2023-03-24 ENCOUNTER — Emergency Department
Admission: EM | Admit: 2023-03-24 | Discharge: 2023-03-24 | Disposition: A | Discharge status: Home / Self Care | Payer: Medicaid Other | Attending: Emergency Medicine | Admitting: Emergency Medicine

## 2023-03-24 DIAGNOSIS — Z20822 Contact with and (suspected) exposure to covid-19: Secondary | ICD-10-CM | POA: Insufficient documentation

## 2023-03-24 DIAGNOSIS — J069 Acute upper respiratory infection, unspecified: Secondary | ICD-10-CM | POA: Diagnosis not present

## 2023-03-24 DIAGNOSIS — M791 Myalgia, unspecified site: Secondary | ICD-10-CM | POA: Diagnosis not present

## 2023-03-24 DIAGNOSIS — R059 Cough, unspecified: Secondary | ICD-10-CM | POA: Diagnosis present

## 2023-03-24 LAB — SARS CORONAVIRUS 2 BY RT PCR: SARS Coronavirus 2 by RT PCR: NEGATIVE

## 2023-03-24 MED ORDER — BENZONATATE 100 MG PO CAPS: 100.0000 mg | ORAL_CAPSULE | Freq: Three times a day (TID) | ORAL | 0 refills | Status: AC | PRN

## 2023-03-24 NOTE — Discharge Instructions (Addendum)
Your COVID test is negative.  Chest x-ray is negative and does not showing any signs of pneumonia.  Is more than likely a viral upper respiratory infection.  You will need to get plenty of rest and drink plenty of water.  Follow-up with your primary care if symptoms do not improve.

## 2023-03-24 NOTE — ED Triage Notes (Signed)
Pt reports productive cough, general bodyache and nasal congestion since yesterday. Pt reports she wok up this am and felt worse. Pt reports she was around someone at work that was sick so thinks she may have gotten it there but is not sure.

## 2023-03-24 NOTE — ED Provider Notes (Signed)
[facility] Emergency Department Provider Note     Event Date/Time   First MD Initiated Contact with Patient 03/24/23 1319     (approximate)   History   Cough, Generalized Body Aches, and Nasal Congestion   HPI  Kimberly Arroyo is a 37 y.o. female presents to the ED with complaint of cough that started last night.  Cough is described as productive with green phlegm.  She endorses sick contacts.  Associated symptoms include generalized bodyaches, chills and 1 episode of vomiting.  She reports loss of appetite.  She has tried ibuprofen with some relief.  Denies fevers, chest pain and shortness of breath.    Physical Exam   Triage Vital Signs: ED Triage Vitals  Encounter Vitals Group     BP 03/24/23 1306 136/66     Systolic BP Percentile --      Diastolic BP Percentile --      Pulse Rate 03/24/23 1306 89     Resp 03/24/23 1306 17     Temp 03/24/23 1306 98 F (36.7 C)     Temp src --      SpO2 03/24/23 1306 98 %     Weight 03/24/23 1246 271 lb 2.7 oz (123 kg)     Height 03/24/23 1246 5\' 5"  (1.651 m)     Head Circumference --      Peak Flow --      Pain Score 03/24/23 1245 8     Pain Loc --      Pain Education --      Exclude from Growth Chart --     Most recent vital signs: Vitals:   03/24/23 1306  BP: 136/66  Pulse: 89  Resp: 17  Temp: 98 F (36.7 C)  SpO2: 98%    General Awake, no distress. Nontoxic. HEENT NCAT. PERRL. EOMI. No rhinorrhea. Mucous membranes are moist.  Oropharynx is clear.  Uvula is midline.  No erythema or exudates. CV:  Good peripheral perfusion.  RRR RESP:  Normal effort.  Mild rhonchi can be heard and right lung with expiration. ABD:  No distention.    ED Results / Procedures / Treatments   Labs (all labs ordered are listed, but only abnormal results are displayed) Labs Reviewed  SARS CORONAVIRUS 2 BY RT PCR   RADIOLOGY  I personally viewed and evaluated these images as part of my medical decision making,  as well as reviewing the written report by the radiologist.  ED Provider Interpretation: Chest x-ray is unremarkable.  DG Chest 2 View  Result Date: 03/24/2023 CLINICAL DATA:  Productive cough EXAM: CHEST - 2 VIEW COMPARISON:  08/01/2021 chest radiograph. FINDINGS: Stable cardiomediastinal silhouette with normal heart size. No pneumothorax. No pleural effusion. Lungs appear clear, with no acute consolidative airspace disease and no pulmonary edema. IMPRESSION: No active cardiopulmonary disease. Electronically Signed   By: Delbert Phenix M.D.   On: 03/24/2023 14:02    PROCEDURES:  Critical Care performed: No  Procedures  MEDICATIONS ORDERED IN ED: Medications - No data to display  IMPRESSION / MDM / ASSESSMENT AND PLAN / ED COURSE  I reviewed the triage vital signs and the nursing notes.                                37 y.o. female presents to the emergency department for evaluation and treatment of acute cough. See HPI for further details. Vital signs  are unremarkable.  Physical exam is pertinent for mild right lung rhonchi on auscultation with expiration.    Differential diagnosis includes, but is not limited to COVID, viral upper respiratory infection, pneumonia, bronchitis.  Patient's presentation is most consistent with acute complicated illness / injury requiring diagnostic workup.  COVID test ordered in triage.  Results are negative.  Given presentation of unilateral rhonchi an x-ray is obtained revealing no acute cardiopulmonary abnormalities.  This is ruling out pneumonia.  I suspect presentation is clinically consistent with a viral upper respiratory infection given onset.  Patient is briefly educated on symptomatic care.  Tessalon Perles will be prescribed for her cough.  Patient is to follow-up with primary care if symptoms do not improve. Patient is given ED precautions to return to the ED for any worsening or new symptoms. Patient verbalizes understanding. All questions and  concerns were addressed during ED visit.    FINAL CLINICAL IMPRESSION(S) / ED DIAGNOSES   Final diagnoses:  Viral URI with cough   Rx / DC Orders   ED Discharge Orders          Ordered    benzonatate (TESSALON PERLES) 100 MG capsule  3 times daily PRN        03/24/23 1407           Note:  This document was prepared using Dragon voice recognition software and may include unintentional dictation errors.    Romeo Apple, Laquilla Dault A, PA-C 03/24/23 1610    Sharyn Creamer, MD 03/25/23 1427

## 2024-01-15 ENCOUNTER — Emergency Department

## 2024-01-15 ENCOUNTER — Encounter: Payer: Self-pay | Admitting: Emergency Medicine

## 2024-01-15 ENCOUNTER — Other Ambulatory Visit: Payer: Self-pay

## 2024-01-15 ENCOUNTER — Emergency Department
Admission: EM | Admit: 2024-01-15 | Discharge: 2024-01-15 | Disposition: A | Discharge status: Home / Self Care | Attending: Emergency Medicine | Admitting: Emergency Medicine

## 2024-01-15 DIAGNOSIS — R1030 Lower abdominal pain, unspecified: Secondary | ICD-10-CM | POA: Diagnosis present

## 2024-01-15 DIAGNOSIS — K5641 Fecal impaction: Secondary | ICD-10-CM | POA: Insufficient documentation

## 2024-01-15 DIAGNOSIS — R739 Hyperglycemia, unspecified: Secondary | ICD-10-CM | POA: Insufficient documentation

## 2024-01-15 DIAGNOSIS — R1084 Generalized abdominal pain: Secondary | ICD-10-CM

## 2024-01-15 DIAGNOSIS — R350 Frequency of micturition: Secondary | ICD-10-CM | POA: Diagnosis not present

## 2024-01-15 DIAGNOSIS — K5900 Constipation, unspecified: Secondary | ICD-10-CM

## 2024-01-15 LAB — COMPREHENSIVE METABOLIC PANEL WITH GFR
ALT: 24 U/L (ref 0–44)
AST: 24 U/L (ref 15–41)
Albumin: 3.2 g/dL — ABNORMAL LOW (ref 3.5–5.0)
Alkaline Phosphatase: 50 U/L (ref 38–126)
Anion gap: 9 (ref 5–15)
BUN: 11 mg/dL (ref 6–20)
CO2: 23 mmol/L (ref 22–32)
Calcium: 8.4 mg/dL — ABNORMAL LOW (ref 8.9–10.3)
Chloride: 103 mmol/L (ref 98–111)
Creatinine, Ser: 0.69 mg/dL (ref 0.44–1.00)
GFR, Estimated: >60 mL/min (ref >60)
Glucose, Bld: 169 mg/dL — ABNORMAL HIGH (ref 70–99)
Potassium: 3.7 mmol/L (ref 3.5–5.1)
Sodium: 135 mmol/L (ref 135–145)
Total Bilirubin: 0.8 mg/dL (ref 0.0–1.2)
Total Protein: 6.3 g/dL — ABNORMAL LOW (ref 6.5–8.1)

## 2024-01-15 LAB — CBC
HCT: 37.4 % (ref 36.0–46.0)
Hemoglobin: 12.6 g/dL (ref 12.0–15.0)
MCH: 27.8 pg (ref 26.0–34.0)
MCHC: 33.7 g/dL (ref 30.0–36.0)
MCV: 82.6 fL (ref 80.0–100.0)
Platelets: 215 10*3/uL (ref 150–400)
RBC: 4.53 MIL/uL (ref 3.87–5.11)
RDW: 13.8 % (ref 11.5–15.5)
WBC: 9.5 10*3/uL (ref 4.0–10.5)
nRBC: 0 % (ref 0.0–0.2)

## 2024-01-15 LAB — URINALYSIS, ROUTINE W REFLEX MICROSCOPIC
Bilirubin Urine: NEGATIVE
Glucose, UA: NEGATIVE mg/dL
Hgb urine dipstick: NEGATIVE
Ketones, ur: NEGATIVE mg/dL
Leukocytes,Ua: NEGATIVE
Nitrite: NEGATIVE
Protein, ur: NEGATIVE mg/dL
Specific Gravity, Urine: 1.006 (ref 1.005–1.030)
pH: 6 (ref 5.0–8.0)

## 2024-01-15 LAB — POC URINE PREG, ED: Preg Test, Ur: NEGATIVE

## 2024-01-15 LAB — LIPASE, BLOOD: Lipase: 33 U/L (ref 11–51)

## 2024-01-15 MED ORDER — ONDANSETRON HCL 4 MG/2ML IJ SOLN
4.0000 mg | Freq: Once | INTRAMUSCULAR | Status: AC
  Administered 2024-01-15: 4 mg via INTRAVENOUS
  Filled 2024-01-15: qty 2

## 2024-01-15 MED ORDER — IOHEXOL 300 MG/ML  SOLN
100.0000 mL | Freq: Once | INTRAMUSCULAR | Status: AC | PRN
Start: 2024-01-15 — End: 2024-01-15
  Administered 2024-01-15: 100 mL via INTRAVENOUS

## 2024-01-15 MED ORDER — SODIUM CHLORIDE 0.9 % IV BOLUS
1000.0000 mL | Freq: Once | INTRAVENOUS | Status: AC
  Administered 2024-01-15: 1000 mL via INTRAVENOUS

## 2024-01-15 NOTE — ED Notes (Signed)
 Discharge instructions reviewed with patient. Patient questions answered and opportunity for education reviewed. Patient voices understanding of discharge instructions with no further questions. Patient ambulatory with steady gait to lobby.

## 2024-01-15 NOTE — ED Provider Notes (Signed)
 [facility] Provider Note    Event Date/Time   First MD Initiated Contact with Patient 01/15/24 0805     (approximate)   History   Abdominal Pain   HPI  Kimberly Arroyo is a 38 y.o. female with history of diverticulitis, depression, anxiety, PCOS and as listed in EMR presents to the emergency department for treatment and evaluation of lower abdominal pain for the past week.  She is also experiencing urinary frequency and nausea..      Physical Exam   Triage Vital Signs: ED Triage Vitals  Encounter Vitals Group     BP 01/15/24 0746 127/68     Systolic BP Percentile --      Diastolic BP Percentile --      Pulse Rate 01/15/24 0746 92     Resp 01/15/24 0746 17     Temp 01/15/24 0746 98.4 F (36.9 C)     Temp Source 01/15/24 0746 Oral     SpO2 01/15/24 0746 100 %     Weight 01/15/24 0745 273 lb (123.8 kg)     Height 01/15/24 0745 5\' 5"  (1.651 m)     Head Circumference --      Peak Flow --      Pain Score 01/15/24 0745 10     Pain Loc --      Pain Education --      Exclude from Growth Chart --     Most recent vital signs: Vitals:   01/15/24 0746  BP: 127/68  Pulse: 92  Resp: 17  Temp: 98.4 F (36.9 C)  SpO2: 100%    General: Awake, no distress.  CV:  Good peripheral perfusion.  Resp:  Normal effort.  Abd:  No distention.  No focal abdominal tenderness. No rebound tenderness. Other:     ED Results / Procedures / Treatments   Labs (all labs ordered are listed, but only abnormal results are displayed) Labs Reviewed  URINALYSIS, ROUTINE W REFLEX MICROSCOPIC - Abnormal; Notable for the following components:      Result Value   Color, Urine STRAW (*)    APPearance CLEAR (*)    All other components within normal limits  LIPASE, BLOOD  COMPREHENSIVE METABOLIC PANEL WITH GFR  CBC  POC URINE PREG, ED     EKG  Not indicated   RADIOLOGY  Image and radiology report reviewed and interpreted by me. Radiology report consistent  with the same.  CT abdomen and pelvis negative for acute concerns.  PROCEDURES:  Critical Care performed: No  Procedures   MEDICATIONS ORDERED IN ED:  Medications - No data to display   IMPRESSION / MDM / ASSESSMENT AND PLAN / ED COURSE   I have reviewed the triage note.  Differential diagnosis includes, but is not limited to, diverticulitis, colitis, constipation, viral syndrome, intra-abdominal infection  Patient's presentation is most consistent with acute presentation with potential threat to life or bodily function.  38 year old female presenting to the emergency department for treatment and evaluation of low abdominal pain for the past week.  See HPI for further details.  Vital signs are reassuring.  She is not tachycardic or febrile.  No focal abdominal tenderness on exam.  Lab studies showed no leukocytosis, CMP shows a nonfasting glucose of 169 but otherwise normal, normal lipase, urinalysis is clear, and pregnancy test negative.  Results discussed with the patient.  CT shows moderate amount of retained stool.  She was encouraged to take milk of magnesia or the generic  equivalent.  She is to follow-up with her primary care provider if symptoms or not improving over the week.  She is to return to the emergency department for symptoms of change or worsen if she is unable to schedule an appointment.      FINAL CLINICAL IMPRESSION(S) / ED DIAGNOSES   Final diagnoses:  None     Rx / DC Orders   ED Discharge Orders     None        Note:  This document was prepared using Dragon voice recognition software and may include unintentional dictation errors.   Sherryle Don, FNP 01/15/24 1432    Viviano Ground, MD 01/15/24 1531

## 2024-01-15 NOTE — ED Triage Notes (Signed)
 Patient to ED via POV for lower abd pain. Ongoing x1 week. Also having urinary frequency and nausea. Hx diverticulitis.

## 2024-04-04 ENCOUNTER — Emergency Department
Admission: EM | Admit: 2024-04-04 | Discharge: 2024-04-04 | Disposition: A | Discharge status: Home / Self Care | Attending: Emergency Medicine | Admitting: Emergency Medicine

## 2024-04-04 ENCOUNTER — Other Ambulatory Visit: Payer: Self-pay

## 2024-04-04 DIAGNOSIS — N760 Acute vaginitis: Secondary | ICD-10-CM | POA: Diagnosis not present

## 2024-04-04 DIAGNOSIS — B9689 Other specified bacterial agents as the cause of diseases classified elsewhere: Secondary | ICD-10-CM | POA: Insufficient documentation

## 2024-04-04 DIAGNOSIS — R350 Frequency of micturition: Secondary | ICD-10-CM | POA: Diagnosis present

## 2024-04-04 LAB — WET PREP, GENITAL
Sperm: NONE SEEN
Trich, Wet Prep: NONE SEEN
WBC, Wet Prep HPF POC: >=10 — AB (ref <10)
Yeast Wet Prep HPF POC: NONE SEEN

## 2024-04-04 LAB — PREGNANCY, URINE: Preg Test, Ur: NEGATIVE

## 2024-04-04 LAB — URINALYSIS, ROUTINE W REFLEX MICROSCOPIC
Bilirubin Urine: NEGATIVE
Glucose, UA: NEGATIVE mg/dL
Hgb urine dipstick: NEGATIVE
Ketones, ur: 5 mg/dL — AB
Leukocytes,Ua: NEGATIVE
Nitrite: NEGATIVE
Protein, ur: NEGATIVE mg/dL
Specific Gravity, Urine: 1.021 (ref 1.005–1.030)
pH: 5 (ref 5.0–8.0)

## 2024-04-04 LAB — CHLAMYDIA/NGC RT PCR (ARMC ONLY)
Chlamydia Tr: NOT DETECTED
N gonorrhoeae: NOT DETECTED

## 2024-04-04 MED ORDER — METRONIDAZOLE 500 MG PO TABS: 500.0000 mg | ORAL_TABLET | Freq: Three times a day (TID) | ORAL | 0 refills | Status: AC

## 2024-04-04 NOTE — ED Triage Notes (Signed)
 Pt to ED via POV for c/o urinary frequency. States she wants checked for STDs. Denies pain.

## 2024-04-04 NOTE — ED Provider Notes (Signed)
 [facility] Provider Note    None    (approximate)   History   Urinary Frequency   HPI  Kimberly Arroyo is a 38 y.o. female   presents to the ED with complaint of urinary frequency.  Patient denies any known exposure to STDs but thought she needed to be checked.  No nausea, vomiting, fever, chills has been experienced.  She also denies any vaginal discharge or pain.      Physical Exam   Triage Vital Signs: ED Triage Vitals  Encounter Vitals Group     BP 04/04/24 0743 120/71     Girls Systolic BP Percentile --      Girls Diastolic BP Percentile --      Boys Systolic BP Percentile --      Boys Diastolic BP Percentile --      Pulse Rate 04/04/24 0743 61     Resp 04/04/24 0743 16     Temp 04/04/24 0743 98.5 F (36.9 C)     Temp Source 04/04/24 0743 Oral     SpO2 04/04/24 0743 100 %     Weight 04/04/24 0745 268 lb (121.6 kg)     Height 04/04/24 0745 5' 5 (1.651 m)     Head Circumference --      Peak Flow --      Pain Score 04/04/24 0743 0     Pain Loc --      Pain Education --      Exclude from Growth Chart --     Most recent vital signs: Vitals:   04/04/24 0743  BP: 120/71  Pulse: 61  Resp: 16  Temp: 98.5 F (36.9 C)  SpO2: 100%     General: Awake, no distress.  CV:  Good peripheral perfusion.  Resp:  Normal effort.  Abd:  No distention.  Other:     ED Results / Procedures / Treatments   Labs (all labs ordered are listed, but only abnormal results are displayed) Labs Reviewed  WET PREP, GENITAL - Abnormal; Notable for the following components:      Result Value   Clue Cells Wet Prep HPF POC PRESENT (*)    WBC, Wet Prep HPF POC >=10 (*)    All other components within normal limits  URINALYSIS, ROUTINE W REFLEX MICROSCOPIC - Abnormal; Notable for the following components:   Color, Urine YELLOW (*)    APPearance CLEAR (*)    Ketones, ur 5 (*)    All other components within normal limits  CHLAMYDIA/NGC RT PCR ([facility] ONLY)             PREGNANCY, URINE      PROCEDURES:  Critical Care performed:   Procedures   MEDICATIONS ORDERED IN ED: Medications - No data to display   IMPRESSION / MDM / ASSESSMENT AND PLAN / ED COURSE  I reviewed the triage vital signs and the nursing notes.   Differential diagnosis includes, but is not limited to, urinary tract infection, bacterial vaginosis, yeast vaginosis, trichomonas, STI.  38 year old female presents to the ED with complaint of dysuria.  Patient was made aware that her wet prep did show clue cells and that she needed to be treated for bacterial vaginosis.  Pregnancy test and urine were negative.  A chlamydia GC swab was sent to the lab and was not detected.  A prescription for Flagyl  sent to the pharmacy to begin taking until finished.  Patient was made aware that she should follow-up with her  PCP if any continued problems or return to the emergency department if any severe worsening of her symptoms.      Patient's presentation is most consistent with acute complicated illness / injury requiring diagnostic workup.  FINAL CLINICAL IMPRESSION(S) / ED DIAGNOSES   Final diagnoses:  BV (bacterial vaginosis)     Rx / DC Orders   ED Discharge Orders          Ordered    metroNIDAZOLE  (FLAGYL ) 500 MG tablet  3 times daily        04/04/24 0851             Note:  This document was prepared using Dragon voice recognition software and may include unintentional dictation errors.   Saunders Shona CROME, PA-C 04/04/24 1505    Dorothyann Drivers, MD 04/04/24 1520

## 2024-04-04 NOTE — ED Notes (Signed)
 See triage note  Presents with some urinary freq which started about 1 week ago Was see for same about 1-2 months ago  Was told no UTI  she is concerned that she may have STD

## 2024-04-04 NOTE — Discharge Instructions (Addendum)
 Follow-up with [facility] department if any continued problems or concerns.  A prescription for Flagyl  was sent to the pharmacy for you to begin taking for your bacterial infection.  The results of your STD panel has not been resulted at this time.  If they are positive you will get a phone call.
